# Patient Record
Sex: Female | Born: 1935 | Race: White | Hispanic: No | Smoking: Former smoker
Health system: Southern US, Community
[De-identification: ages and names within clinical notes are randomized; demographics above are authoritative.]

## PROBLEM LIST (undated history)

## (undated) DIAGNOSIS — I2699 Other pulmonary embolism without acute cor pulmonale: Secondary | ICD-10-CM

## (undated) DIAGNOSIS — I499 Cardiac arrhythmia, unspecified: Secondary | ICD-10-CM

## (undated) DIAGNOSIS — I714 Abdominal aortic aneurysm, without rupture, unspecified: Secondary | ICD-10-CM

## (undated) DIAGNOSIS — I509 Heart failure, unspecified: Secondary | ICD-10-CM

## (undated) DIAGNOSIS — N289 Disorder of kidney and ureter, unspecified: Secondary | ICD-10-CM

## (undated) DIAGNOSIS — M810 Age-related osteoporosis without current pathological fracture: Secondary | ICD-10-CM

## (undated) DIAGNOSIS — E7800 Pure hypercholesterolemia, unspecified: Secondary | ICD-10-CM

## (undated) DIAGNOSIS — I7090 Unspecified atherosclerosis: Secondary | ICD-10-CM

## (undated) DIAGNOSIS — J449 Chronic obstructive pulmonary disease, unspecified: Secondary | ICD-10-CM

## (undated) DIAGNOSIS — I10 Essential (primary) hypertension: Secondary | ICD-10-CM

## (undated) DIAGNOSIS — M949 Disorder of cartilage, unspecified: Secondary | ICD-10-CM

## (undated) DIAGNOSIS — I2510 Atherosclerotic heart disease of native coronary artery without angina pectoris: Secondary | ICD-10-CM

## (undated) DIAGNOSIS — R0602 Shortness of breath: Secondary | ICD-10-CM

## (undated) DIAGNOSIS — D649 Anemia, unspecified: Secondary | ICD-10-CM

## (undated) DIAGNOSIS — M899 Disorder of bone, unspecified: Secondary | ICD-10-CM

## (undated) DIAGNOSIS — I4891 Unspecified atrial fibrillation: Secondary | ICD-10-CM

## (undated) DIAGNOSIS — M009 Pyogenic arthritis, unspecified: Secondary | ICD-10-CM

## (undated) HISTORY — DX: Abdominal aortic aneurysm, without rupture: I71.4

## (undated) HISTORY — DX: Chronic obstructive pulmonary disease, unspecified: J44.9

## (undated) HISTORY — DX: Unspecified atrial fibrillation: I48.91

## (undated) HISTORY — DX: Essential (primary) hypertension: I10

## (undated) HISTORY — PX: CHOLECYSTECTOMY: SHX55

## (undated) HISTORY — PX: CATARACT EXTRACTION: SUR2

## (undated) HISTORY — DX: Disorder of bone, unspecified: M89.9

## (undated) HISTORY — PX: PARTIAL HYSTERECTOMY: SHX80

## (undated) HISTORY — DX: Unspecified atherosclerosis: I70.90

## (undated) HISTORY — DX: Disorder of kidney and ureter, unspecified: N28.9

## (undated) HISTORY — DX: Age-related osteoporosis without current pathological fracture: M81.0

## (undated) HISTORY — PX: HIP FRACTURE SURGERY: SHX118

## (undated) HISTORY — PX: TONSILLECTOMY: SUR1361

## (undated) HISTORY — DX: Anemia, unspecified: D64.9

## (undated) HISTORY — PX: OTHER SURGICAL HISTORY: SHX169

## (undated) HISTORY — DX: Pure hypercholesterolemia, unspecified: E78.00

## (undated) HISTORY — PX: CARDIAC CATHETERIZATION: SHX172

## (undated) HISTORY — PX: COLONOSCOPY: SHX174

## (undated) HISTORY — DX: Abdominal aortic aneurysm, without rupture, unspecified: I71.40

## (undated) HISTORY — DX: Other pulmonary embolism without acute cor pulmonale: I26.99

## (undated) HISTORY — DX: Cardiac arrhythmia, unspecified: I49.9

## (undated) HISTORY — DX: Pyogenic arthritis, unspecified: M00.9

## (undated) HISTORY — PX: GANGLION CYST EXCISION: SHX1691

## (undated) HISTORY — DX: Disorder of cartilage, unspecified: M94.9

---

## 2012-05-02 ENCOUNTER — Encounter: Payer: Self-pay | Admitting: Internal Medicine

## 2012-05-03 ENCOUNTER — Ambulatory Visit (INDEPENDENT_AMBULATORY_CARE_PROVIDER_SITE_OTHER)
Admission: RE | Admit: 2012-05-03 | Discharge: 2012-05-03 | Disposition: A | Discharge status: Home / Self Care | Payer: Medicare Other | Source: Ambulatory Visit | Attending: Internal Medicine | Admitting: Internal Medicine

## 2012-05-03 ENCOUNTER — Encounter: Payer: Self-pay | Admitting: Internal Medicine

## 2012-05-03 ENCOUNTER — Ambulatory Visit (INDEPENDENT_AMBULATORY_CARE_PROVIDER_SITE_OTHER): Payer: Medicare Other | Admitting: Internal Medicine

## 2012-05-03 VITALS — BP 140/50 | HR 61 | Temp 98.0°F | Ht 62.0 in | Wt 118.0 lb

## 2012-05-03 DIAGNOSIS — R918 Other nonspecific abnormal finding of lung field: Secondary | ICD-10-CM

## 2012-05-03 DIAGNOSIS — C3410 Malignant neoplasm of upper lobe, unspecified bronchus or lung: Secondary | ICD-10-CM | POA: Insufficient documentation

## 2012-05-03 NOTE — Progress Notes (Signed)
  Subjective:    Patient ID: Kara Holloway, female    DOB: [birth year]  MRN: [id number]  HPI  18 yowf quit smoking 1989 because husband's illness, she was fine then started to need inhalers late 1990's and referred 05/03/2012 to pulmonary clinic by Nedra Hai for eval MPN    05/03/2012 1st pulmonary eval/ cc acute arthritis in spring 2013 dx as RA by Dr Ula Lingo in [facility] with cxr showing nodules > CT Chest worrisome for met ca.  On Prednisone x one month and much better. Breathing is fine = doe x fast walk at a mall, steps stops at stop.   No unusual cough, purulent sputum or sinus/hb symptoms on present rx.   Sleeping ok without nocturnal  or early am exacerbation  of respiratory  c/o's or need for noct saba. Also denies any obvious fluctuation of symptoms with weather or environmental changes or other aggravating or alleviating factors except as outlined above      Review of Systems  Constitutional: Negative for fever, chills and unexpected weight change.  HENT: Negative for ear pain, nosebleeds, congestion, sore throat, rhinorrhea, sneezing, trouble swallowing, dental problem, voice change, postnasal drip and sinus pressure.   Eyes: Negative for visual disturbance.  Respiratory: Positive for shortness of breath. Negative for cough and choking.   Cardiovascular: Negative for chest pain and leg swelling.  Gastrointestinal: Negative for vomiting, abdominal pain and diarrhea.  Genitourinary: Negative for difficulty urinating.  Musculoskeletal: Negative for arthralgias.  Skin: Negative for rash.  Neurological: Negative for tremors, syncope and headaches.  Hematological: Does not bruise/bleed easily.       Objective:   Physical Exam  Wt 118 05/03/2012  HEENT mild turbinate edema.  Oropharynx no thrush or excess pnd or cobblestoning.  No JVD or cervical adenopathy. Mild accessory muscle hypertrophy. Trachea midline, nl thryroid. Chest was hyperinflated by percussion with diminished breath sounds and  moderate increased exp time without wheeze. Hoover sign positive at mid inspiration. Regular rate and rhythm without murmur gallop or rub or increase P2 or edema.  Abd: no hsm, nl excursion. Ext warm without cyanosis or clubbing.  ? RA nodule vs ganglio cyst L index finger, no other nodules  CXR  05/03/2012 : Right upper lobe nodules as previously described.           Assessment & Plan:

## 2012-05-03 NOTE — Patient Instructions (Addendum)
Please remember to go to the x-ray department downstairs for your tests - we will call you with the results when they are available.  Please schedule a follow up office visit in 4-6 weeks, call sooner if needed with PFT's on bring the chest rays from [place] with you

## 2012-05-03 NOTE — Progress Notes (Signed)
Quick Note:  Spoke with pt and notified of results per Dr. Wert. Pt verbalized understanding and denied any questions.  ______ 

## 2012-05-03 NOTE — Assessment & Plan Note (Signed)
-  CT chest 04/05/12  Spiculated nodule in the right upper lobe measures 1.6 x 1 cm.  A second nodule in the right upper lobe measures 1.3 x 1.2 cm   Small nodules are noted bilateral lower lobe and right upper lobe anteriorly. Metastatic disease cannot be excluded.  -CXR 05/03/2012 13 mm spiculated lesion in the right upper lobe. 15 mm  more lobular nodule superior lateral to the right hilum t  lung clear.    These lesions not viz on plain cxr 05/04/11 but this was before the acute onset of RA symptoms so it's possible these are RA nodules but if so should regress as RA is treated.  Unfortunately may have Stage IV lung ca also in ddx and will need a tissue dx, probably requiring ebus,  For definitive dx.  Since "early" dx of Stage IV not really likely to be helpful here at age 78, gave pt option of conservative f/u and she agreed to return in 6 weeks with old xrays and if we can docoment definite progression of nodules while ra under better control then consider bx then.

## 2012-06-19 ENCOUNTER — Ambulatory Visit: Payer: Medicare Other | Admitting: Internal Medicine

## 2012-07-11 ENCOUNTER — Ambulatory Visit (INDEPENDENT_AMBULATORY_CARE_PROVIDER_SITE_OTHER)
Admission: RE | Admit: 2012-07-11 | Discharge: 2012-07-11 | Disposition: A | Discharge status: Home / Self Care | Payer: Medicare Other | Source: Ambulatory Visit | Attending: Internal Medicine | Admitting: Internal Medicine

## 2012-07-11 ENCOUNTER — Ambulatory Visit (INDEPENDENT_AMBULATORY_CARE_PROVIDER_SITE_OTHER): Payer: Medicare Other | Admitting: Internal Medicine

## 2012-07-11 ENCOUNTER — Encounter: Payer: Self-pay | Admitting: Internal Medicine

## 2012-07-11 VITALS — BP 138/62 | HR 70 | Temp 97.0°F | Ht 63.0 in | Wt 122.0 lb

## 2012-07-11 DIAGNOSIS — J449 Chronic obstructive pulmonary disease, unspecified: Secondary | ICD-10-CM

## 2012-07-11 DIAGNOSIS — R918 Other nonspecific abnormal finding of lung field: Secondary | ICD-10-CM

## 2012-07-11 DIAGNOSIS — Z23 Encounter for immunization: Secondary | ICD-10-CM

## 2012-07-11 LAB — PULMONARY FUNCTION TEST

## 2012-07-11 NOTE — Progress Notes (Signed)
  Subjective:    Patient ID: Kara Holloway, female    DOB: 1926-05-31  MRN: [id number]  HPI  3 yowf quit smoking 1989 doe at that time did require "some inhalers" better once spiriva started referred 05/03/2012 to pulmonary clinic by Nedra Hai for eval MPN proved to have GOLD III 07/11/2012    05/03/2012 1st pulmonary eval/ cc acute arthritis in spring 2013 dx as RA by Dr Ula Lingo in [facility] with cxr showing nodules > CT Chest worrisome for met ca.  On Prednisone x one month and much better. Breathing is fine = doe x fast walk at a mall, steps ok but stops at stop rec Please schedule a follow up office visit in 4-6 weeks, call sooner if needed with PFT's on bring the chest rays [place] with you > "I had them sent"  07/11/2012 f/u ov/Wert cc doe no change = x walking to friend's 3/4 block flat not as good with hills. No obvious daytime variabilty or assoc chronic cough or cp or chest tightness, subjective wheeze overt sinus or hb symptoms. No unusual exp hx    Was on just spiriva and ok then added advair 250 but just once a day and no better    Sleeping ok without nocturnal  or early am exacerbation  of respiratory  c/o's or need for noct saba. Also denies any obvious fluctuation of symptoms with weather or environmental changes or other aggravating or alleviating factors except as outlined above.    ROS  The following are not active complaints unless bolded sore throat, dysphagia, dental problems, itching, sneezing,  nasal congestion or excess/ purulent secretions, ear ache,   fever, chills, sweats, unintended wt loss, pleuritic or exertional cp, hemoptysis,  orthopnea pnd or leg swelling, presyncope, palpitations, heartburn, abdominal pain, anorexia, nausea, vomiting, diarrhea  or change in bowel or urinary habits, change in stools or urine, dysuria,hematuria,  rash, arthralgias, visual complaints, headache, numbness weakness or ataxia or problems with walking or coordination,  change in mood/affect  or memory.              Objective:   Physical Exam  Wt 118 05/03/2012 > 122 07/11/2012  HEENT mild turbinate edema.  Oropharynx no thrush or excess pnd or cobblestoning.  No JVD or cervical adenopathy. Mild accessory muscle hypertrophy. Trachea midline, nl thryroid. Chest was hyperinflated by percussion with diminished breath sounds and moderate increased exp time without wheeze. Hoover sign positive at mid inspiration. Regular rate and rhythm without murmur gallop or rub or increase P2 or edema.  Abd: no hsm, nl excursion. Ext warm without cyanosis or clubbing.  ? RA nodule vs ganglio cyst L index finger, no other nodules    CXR  07/11/2012 :  1. Nodular densities in the right upper lobe persist > no change  Vs previous studies           Assessment & Plan:

## 2012-07-11 NOTE — Assessment & Plan Note (Addendum)
-  CT chest 04/05/12  Spiculated nodule in the right upper lobe measures 1.6 x 1 cm.  A second nodule in the right upper lobe measures 1.3 x 1.2 cm   Small nodules are noted bilateral lower lobe and right upper lobe anteriorly. Metastatic disease cannot be excluded.  -CXR 05/03/2012 13 mm spiculated lesion in the right upper lobe. 15 mm  more lobular nodule superior lateral to the right hilum t  lung clear.   - Cxr 07/11/2012 no change multiple rul nodules  Given severity of copd and multple locations of nodules in pt with presumed RA these are likely benign with no significant short term growth but the only way to be sure they're all benign is either remove all the big ones (which is not practical esp given GOLD III copd or follow with plain cxr and consider bx of any that show def "macroscopic" growth  Discussed in detail all the  indications, usual  risks and alternatives  relative to the benefits with patient who agrees to proceed with conservative f/u at 6 months

## 2012-07-11 NOTE — Patient Instructions (Addendum)
Please remember to go to the x-ray department downstairs for your tests - we will call you with the results when they are available.  If your breathing is doing the same after you stop prednisone for a few weeks ok to try leaving off the advair and see if you note any change in your activity tolerance and if not just leave it off  Please schedule a follow up visit in 6 months but call sooner if needed

## 2012-07-11 NOTE — Assessment & Plan Note (Signed)
-   PFT's 07/11/2012  FEV1 0.69 (43%) ratio 36 and no better with B2 and DLCO 44 corrects to 69  GOLD III but relatively well compensated on spiriva and minimal advair so probably ok to try off advair

## 2012-07-11 NOTE — Progress Notes (Signed)
PFT done today. 

## 2012-07-12 NOTE — Progress Notes (Signed)
Quick Note:  Spoke with pt and notified of results per Dr. Wert. Pt verbalized understanding and denied any questions.  ______ 

## 2012-07-31 ENCOUNTER — Encounter: Payer: Self-pay | Admitting: Internal Medicine

## 2012-12-05 ENCOUNTER — Telehealth: Payer: Self-pay | Admitting: *Deleted

## 2012-12-05 NOTE — Telephone Encounter (Signed)
Spoke with pt and notified due for rov with cxr She verbalized understanding and I have scheduled appt with MW for 12/19/12 at 2:15 pm

## 2012-12-05 NOTE — Telephone Encounter (Signed)
Message copied by Christen Butter on Thu Dec 05, 2012  4:48 PM ------      Message from: Sandrea Hughs B      Created: Thu Jul 11, 2012  1:37 PM       Ov with cxr due ------

## 2012-12-19 ENCOUNTER — Encounter: Payer: Self-pay | Admitting: Internal Medicine

## 2012-12-19 ENCOUNTER — Ambulatory Visit (INDEPENDENT_AMBULATORY_CARE_PROVIDER_SITE_OTHER)
Admission: RE | Admit: 2012-12-19 | Discharge: 2012-12-19 | Disposition: A | Discharge status: Home / Self Care | Payer: Medicare Other | Source: Ambulatory Visit | Attending: Internal Medicine | Admitting: Internal Medicine

## 2012-12-19 ENCOUNTER — Ambulatory Visit (INDEPENDENT_AMBULATORY_CARE_PROVIDER_SITE_OTHER): Payer: Medicare Other | Admitting: Internal Medicine

## 2012-12-19 VITALS — BP 128/68 | HR 64 | Temp 98.6°F | Ht 62.0 in | Wt 128.8 lb

## 2012-12-19 DIAGNOSIS — R918 Other nonspecific abnormal finding of lung field: Secondary | ICD-10-CM

## 2012-12-19 DIAGNOSIS — J449 Chronic obstructive pulmonary disease, unspecified: Secondary | ICD-10-CM

## 2012-12-19 MED ORDER — ALBUTEROL SULFATE HFA 108 (90 BASE) MCG/ACT IN AERS: 2.0000 | INHALATION_SPRAY | Freq: Four times a day (QID) | RESPIRATORY_TRACT | Status: DC | PRN

## 2012-12-19 NOTE — Progress Notes (Signed)
  Subjective:    Patient ID: Kara Holloway, female    DOB: July 16, 1926  MRN: [id number]  HPI  17 yowf quit smoking 1989 doe at that time did require "some inhalers" better once spiriva started referred 05/03/2012 to pulmonary clinic by Kara Holloway for eval MPN proved to have GOLD III 07/11/2012    05/03/2012 1st pulmonary eval/ cc acute arthritis in spring 2013 dx as RA by Dr Kara Holloway in [facility] with cxr showing nodules > CT Chest worrisome for met ca.  On Prednisone x one month and much better. Breathing is fine = doe x fast walk at a mall, steps ok but stops at stop rec Please schedule a follow up office visit in 4-6 weeks, call sooner if needed with PFT's and  bring [place] chest rays [place] you > "I had them sent"  07/11/2012 f/u ov/Kara Holloway cc doe no change = x walking to friend's 3/4 block flat not as good with hills.   Was on just spiriva and ok then added advair 250 but just once a day and no better rec Ok to try wean advair back to off if not helping   12/19/2012 f/u ov/Kara Holloway f/u copd/ mpn Chief Complaint  Patient presents with  . Follow-up    Breathing about the same since the last visit.    No obvious daytime variabilty or assoc chronic cough or cp or chest tightness, subjective wheeze overt sinus or hb symptoms. No unusual exp hx or h/o childhood pna/ asthma or premature birth to her knowledge.  Prednisone is at 5 mg per day and RA symptoms tolerable at present with only a few flares x 6 months      Sleeping ok without nocturnal  or early am exacerbation  of respiratory  c/o's or need for noct saba. Also denies any obvious fluctuation of symptoms with weather or environmental changes or other aggravating or alleviating factors except as outlined above.    ROS  The following are not active complaints unless bolded sore throat, dysphagia, dental problems, itching, sneezing,  nasal congestion or excess/ purulent secretions, ear ache,   fever, chills, sweats, unintended wt loss, pleuritic  or exertional cp, hemoptysis,  orthopnea pnd or leg swelling, presyncope, palpitations, heartburn, abdominal pain, anorexia, nausea, vomiting, diarrhea  or change in bowel or urinary habits, change in stools or urine, dysuria,hematuria,  rash, arthralgias, visual complaints, headache, numbness weakness or ataxia or problems with walking or coordination,  change in mood/affect or memory.              Objective:   Physical Exam  Wt 118 05/03/2012 > 122 07/11/2012 >  128 12/19/2012  HEENT mild turbinate edema.  Oropharynx no thrush or excess pnd or cobblestoning.  No JVD or cervical adenopathy. Mild accessory muscle hypertrophy. Trachea midline, nl thryroid. Chest was hyperinflated by percussion with diminished breath sounds and moderate increased exp time without wheeze. Hoover sign positive at mid inspiration. Regular rate and rhythm without murmur gallop or rub or increase P2 or edema.  Abd: no hsm, nl excursion. Ext warm without cyanosis or clubbing.  ? RA nodule vs ganglio cyst L index finger, no other nodules    CXR  12/19/2012 :  Progression in size of nodule in the right upper lobe            Assessment & Plan:

## 2012-12-19 NOTE — Assessment & Plan Note (Signed)
-  CT chest 04/05/12  Spiculated nodule in the right upper lobe measures 1.6 x 1 cm.  A second nodule in the right upper lobe measures 1.3 x 1.2 cm   Small nodules are noted bilateral lower lobe and right upper lobe anteriorly. Metastatic disease cannot be excluded.  -CXR 05/03/2012 13 mm spiculated lesion in the right upper lobe. 15 mm  more lobular nodule superior lateral to the right hilum t  lung clear.   - Cxr 07/11/2012 no change multiple rul nodules - CXR  12/19/2012 :  Progression in size of nodule in the right upper lobe (referring to the most inferior of the nodules)   In a pt with RA and multiple nodules, the one that seems to be growing relative to the others may be lung ca and unrelated to the other nodules but the pt ifs extremely reluctant to undergo any diagnostic studies at this time.  Discussed in detail all the  indications, usual  risks and alternatives  relative to the benefits with patient who agrees to proceed with conservative f/u.

## 2012-12-19 NOTE — Assessment & Plan Note (Addendum)
PFT's 07/11/2012  FEV1 0.69 (43%) ratio 36 and no better with B2 and DLCO 44 corrects to 69  Relatively well compensated on spiriva and daily prednisone so probably doesn't need advair but should sotop combent at this point and just use prn albuterol but monitor whether she needs more once she stops advair.

## 2012-12-19 NOTE — Patient Instructions (Addendum)
Don't stop spiriva but since you're on prednisone daily you may not see much benefit from advair (purple disc) and ok to wean off if makes no difference in how you feel or how  Much you need combivent (or proaire, the preferred rescue inhaler)  Please schedule a follow up visit in 6  months but call sooner if needed

## 2013-06-06 ENCOUNTER — Encounter: Payer: Self-pay | Admitting: Internal Medicine

## 2013-06-06 ENCOUNTER — Telehealth: Payer: Self-pay | Admitting: *Deleted

## 2013-06-06 NOTE — Telephone Encounter (Signed)
Spoke with the pt and scheduled appt for 06/19/13 at 1:30

## 2013-06-06 NOTE — Telephone Encounter (Signed)
Message copied by Christen Butter on Fri Jun 06, 2013  2:51 PM ------      Message from: Sandrea Hughs B      Created: Fri Jun 06, 2013  9:00 AM       Needs ov re lung nodules this month ------

## 2013-06-19 ENCOUNTER — Encounter: Payer: Self-pay | Admitting: Internal Medicine

## 2013-06-19 ENCOUNTER — Ambulatory Visit (INDEPENDENT_AMBULATORY_CARE_PROVIDER_SITE_OTHER): Payer: Medicare Other | Admitting: Internal Medicine

## 2013-06-19 VITALS — BP 132/62 | HR 68 | Temp 97.6°F | Ht 62.0 in | Wt 129.0 lb

## 2013-06-19 DIAGNOSIS — J449 Chronic obstructive pulmonary disease, unspecified: Secondary | ICD-10-CM

## 2013-06-19 DIAGNOSIS — R918 Other nonspecific abnormal finding of lung field: Secondary | ICD-10-CM

## 2013-06-19 NOTE — Patient Instructions (Signed)
Please schedule a follow up visit in 3 months but call sooner if needed with CXR on return

## 2013-06-19 NOTE — Assessment & Plan Note (Signed)
-   PFT's 07/11/2012  FEV1 0.69 (43%) ratio 36 and no better with B2 and DLCO 44 corrects to 69   Adequate control on present rx, reviewed > no change in rx needed

## 2013-06-19 NOTE — Progress Notes (Signed)
Subjective:    Patient ID: Kara Holloway, female    DOB: 02-Dec-1925  MRN: [id number]    Brief patient profile:   37 yowf quit smoking 1989 doe at that time did require "some inhalers" better once spiriva started referred 05/03/2012 to pulmonary clinic by Nedra Hai for eval MPN proved to have GOLD III 07/11/2012   History of Present Illness  05/03/2012 1st pulmonary eval/ cc acute arthritis in spring 2013 dx as RA by Dr Ula Lingo in [facility] with cxr showing nodules > CT Chest worrisome for met ca.  On Prednisone x one month and much better. Breathing is fine = doe x fast walk at a mall, steps ok but stops at top on one flight rec Please schedule a follow up office visit in 4-6 weeks, call sooner if needed with PFT's and  bring [place] chest rays [place] you > "I had them sent"  07/11/2012 f/u ov/Kara Holloway cc doe no change = x walking to friend's 3/4 block flat not as good with hills.   Was on just spiriva and ok then added advair 250 but just once a day and no better rec Ok to try wean advair back to off if not helping      06/19/2013 f/u ov/Kara Holloway re: mpns/ RA/ Chief Complaint  Patient presents with  . Follow-up    Pt states that her breathing is doing well. She states that last time she needed combivent for rescue was approx 3 wks ago.   Arthritis is slt worse no change pred @ 5 mg daily  No skin nodules Sob one side of house to other on advair/ spiriva and prn combivent.  No hemoptysis    No obvious day to day or daytime variabilty or assoc chronic cough or cp or chest tightness, subjective wheeze overt sinus or hb symptoms. No unusual exp hx or h/o childhood pna/ asthma or knowledge of premature birth.  Sleeping ok without nocturnal  or early am exacerbation  of respiratory  c/o's or need for noct saba. Also denies any obvious fluctuation of symptoms with weather or environmental changes or other aggravating or alleviating factors except as outlined above   Current Medications, Allergies,  Complete Past Medical History, Past Surgical History, Family History, and Social History were reviewed in [employer] record.  ROS  The following are not active complaints unless bolded sore throat, dysphagia, dental problems, itching, sneezing,  nasal congestion or excess/ purulent secretions, ear ache,   fever, chills, sweats, unintended wt loss, pleuritic or exertional cp, hemoptysis,  orthopnea pnd or leg swelling, presyncope, palpitations, heartburn, abdominal pain, anorexia, nausea, vomiting, diarrhea  or change in bowel or urinary habits, change in stools or urine, dysuria,hematuria,  rash, arthralgias, visual complaints, headache, numbness weakness or ataxia or problems with walking or coordination,  change in mood/affect or memory.           Objective:   Physical Exam  Wt 118 05/03/2012 > 122 07/11/2012 >  128 12/19/2012 > 129 06/19/2013  HEENT mild turbinate edema.  Oropharynx no thrush or excess pnd or cobblestoning.  No JVD or cervical adenopathy. Mild accessory muscle hypertrophy. Trachea midline, nl thryroid. Chest was hyperinflated by percussion with diminished breath sounds and moderate increased exp time without wheeze. Hoover sign positive at mid inspiration. Regular rate and rhythm without murmur gallop or rub or increase P2 or edema.  Abd: no hsm, nl excursion. Ext warm without cyanosis or clubbing.  ? RA nodule vs ganglio cyst  L index finger, no other nodules     CXR  06/04/13  RUL 2.5 cm  06/04/13  (larger)            Assessment & Plan:

## 2013-06-19 NOTE — Assessment & Plan Note (Signed)
-  CT chest 04/05/12  Spiculated nodule in the right upper lobe measures 1.6 x 1 cm.  A second nodule in the right upper lobe measures 1.3 x 1.2 cm   Small nodules are noted bilateral lower lobe and right upper lobe anteriorly. Metastatic disease cannot be excluded.  -CXR 05/03/2012 13 mm spiculated lesion in the right upper lobe. 15 mm  more lobular nodule superior lateral to the right hilum t  lung clear.   - Cxr 07/11/2012 no change multiple rul nodules - CXR  12/19/2012 :  Progression in size of nodule in the right upper lobe (referring to the most inferior of the nodules) > RUL 2.5 cm  06/04/13     I had an extended discussion with the patient and daughter today lasting 15 to 20 minutes of a 25 minute visit on the following issues:  1) favor met lung ca over RA nodules, none easy to bx 2) she is very reluctant to consider bx, much less take rx at age 79, daughter appears to support her decision  F/u in 3 m with cxr

## 2013-09-18 ENCOUNTER — Ambulatory Visit (INDEPENDENT_AMBULATORY_CARE_PROVIDER_SITE_OTHER)
Admission: RE | Admit: 2013-09-18 | Discharge: 2013-09-18 | Disposition: A | Discharge status: Home / Self Care | Payer: Medicare Other | Source: Ambulatory Visit | Attending: Internal Medicine | Admitting: Internal Medicine

## 2013-09-18 ENCOUNTER — Encounter: Payer: Self-pay | Admitting: Internal Medicine

## 2013-09-18 ENCOUNTER — Ambulatory Visit (INDEPENDENT_AMBULATORY_CARE_PROVIDER_SITE_OTHER): Payer: Medicare Other | Admitting: Internal Medicine

## 2013-09-18 VITALS — BP 108/60 | HR 84 | Temp 98.0°F | Ht 62.0 in | Wt 131.8 lb

## 2013-09-18 DIAGNOSIS — R918 Other nonspecific abnormal finding of lung field: Secondary | ICD-10-CM

## 2013-09-18 DIAGNOSIS — J449 Chronic obstructive pulmonary disease, unspecified: Secondary | ICD-10-CM

## 2013-09-18 MED ORDER — PREDNISONE 5 MG PO TABS: ORAL_TABLET | ORAL | Status: DC

## 2013-09-18 NOTE — Assessment & Plan Note (Signed)
-  CT chest 04/05/12  Spiculated nodule in the right upper lobe measures 1.6 x 1 cm.  A second nodule in the right upper lobe measures 1.3 x 1.2 cm   Small nodules are noted bilateral lower lobe and right upper lobe anteriorly. Metastatic disease cannot be excluded.  -CXR 05/03/2012 13 mm spiculated lesion in the right upper lobe. 15 mm  more lobular nodule superior lateral to the right hilum t  lung clear.   - Cxr 07/11/2012 no change multiple rul nodules - CXR  12/19/2012 :  Progression in size of nodule in the right upper lobe (referring to the most inferior of the nodules) > RUL 2.5 cm  06/04/13   - see ov 06/19/2013 > declines PET/bx until p New Year's  Discussed in detail all the  indications, usual  risks and alternatives  relative to the benefits with patient who agrees to proceed with bronchoscopy with biopsy >>  PET ordered

## 2013-09-18 NOTE — Patient Instructions (Signed)
Prednisone 20 mg per day until arthritis and breathing better, then 10 mg per day for a week, then try 10 alternating with 5 for a week, then 5 mg daily  Please see patient coordinator before you leave today  to schedule PET scan and I will call with results when available with recommendations for next step

## 2013-09-18 NOTE — Progress Notes (Signed)
Subjective:    Patient ID: Kara Holloway, female    DOB: 1926/07/21  MRN: [id number]    Brief patient profile:   81 yowf quit smoking 1989 doe at that time did require "some inhalers" better once spiriva started referred 05/03/2012 to pulmonary clinic by Truman Hayward for eval MPN proved to have GOLD III 07/11/2012   History of Present Illness  05/03/2012 1st pulmonary eval/ cc acute arthritis in spring 2013 dx as RA by Dr Gennaro Africa in [facility] with cxr showing nodules > CT Chest worrisome for met ca.  On Prednisone x one month and much better. Breathing is fine = doe x fast walk at a mall, steps ok but stops at top on one flight rec Please schedule a follow up office visit in 4-6 weeks, call sooner if needed with PFT's and  [place] > "I had them sent"  07/11/2012 f/u ov/Daylani Deblois cc doe no change = x walking to friend's 3/4 block flat not as good with hills.   Was on just spiriva and ok then added advair 250 but just once a day and no better rec Ok to try wean advair back to off if not helping      06/19/2013 f/u ov/Annika Selke re: mpns/ RA/ Chief Complaint  Patient presents with  . Follow-up    Pt states that her breathing is doing well. She states that last time she needed combivent for rescue was approx 3 wks ago.   Arthritis is slt worse no change pred @ 5 mg daily  No skin nodules Sob one side of house to other on advair/ spiriva and prn combivent.  No hemoptysis rec No change rx  09/18/2013 f/u ov/Airrion Otting re: MPN/ RA Chief Complaint  Patient presents with  . Follow-up    Pt states breathing is progressivley worse since her last visit. She uses combivent once every 2-3 wks.      Arthritis is worse on 5 mg per day maint and wants to know why she can't take more. Doe x more than slow adls on advair now bid (helps a little in the am to take the pm dose) and spiriva qam  No obvious day to day or daytime variabilty or assoc chronic cough or cp or chest tightness,  subjective wheeze overt sinus or hb symptoms. No unusual exp hx or h/o childhood pna/ asthma or knowledge of premature birth.  Sleeping ok without nocturnal  or early am exacerbation  of respiratory  c/o's or need for noct saba. Also denies any obvious fluctuation of symptoms with weather or environmental changes or other aggravating or alleviating factors except as outlined above   Current Medications, Allergies, Complete Past Medical History, Past Surgical History, Family History, and Social History were reviewed in [employer] record.  ROS  The following are not active complaints unless bolded sore throat, dysphagia, dental problems, itching, sneezing,  nasal congestion or excess/ purulent secretions, ear ache,   fever, chills, sweats, unintended wt loss, pleuritic or exertional cp, hemoptysis,  orthopnea pnd or leg swelling, presyncope, palpitations, heartburn, abdominal pain, anorexia, nausea, vomiting, diarrhea  or change in bowel or urinary habits, change in stools or urine, dysuria,hematuria,  rash, arthralgias, visual complaints, headache, numbness weakness or ataxia or problems with walking or coordination,  change in mood/affect or memory.           Objective:   Physical Exam  Wt 118 05/03/2012 > 122 07/11/2012 >  128 12/19/2012 >  129 06/19/2013 > 09/18/2013 132  HEENT mild turbinate edema.  Oropharynx no thrush or excess pnd or cobblestoning.  No JVD or cervical adenopathy. Mild accessory muscle hypertrophy. Trachea midline, nl thryroid. Chest was hyperinflated by percussion with diminished breath sounds and moderate increased exp time without wheeze. Hoover sign positive at mid inspiration. Regular rate and rhythm without murmur gallop or rub or increase P2 or edema.  Abd: no hsm, nl excursion. Ext warm without cyanosis or clubbing.  ? RA nodule vs ganglio cyst L index finger, no other nodules     CXR  06/04/13  RUL 2.5 cm  06/04/13  (larger)  CXR  09/18/2013 :   There are 2 spiculated right upper lobe nodules measuring 2.7 and 2.9 cm respectively most concerning for malignancy. There are no other pulmonary masses. There is no pleural effusion or pneumothorax. The lungs are hyperinflated likely secondary to COPD. Stable cardiomediastinal silhouette. Degenerative disc disease throughout the thoracic spine.             Assessment & Plan:

## 2013-09-18 NOTE — Assessment & Plan Note (Signed)
-   PFT's 07/11/2012  FEV1 0.69 (43%) ratio 36 and no better with B2 and DLCO 44 corrects to 69  Severe but slt better on bid advair and has noted better breathing when on pred in past so could have a component of RA bronchiolitis/ ild (though the nodules are much more likely CA  rec The goal with a chronic steroid dependent illness is always arriving at the lowest effective dose that controls the disease/symptoms and not accepting a set "formula" which is based on statistics or guidelines that don't always take into account patient  variability or the natural hx of the dz in every individual patient, which may well vary over time.  For now therefore I recommend the patient maintain  A ceiling of 20 mg and a floor of 5 mg daily

## 2013-10-09 ENCOUNTER — Encounter (HOSPITAL_COMMUNITY)
Admission: RE | Admit: 2013-10-09 | Discharge: 2013-10-09 | Disposition: A | Discharge status: Home / Self Care | Payer: Medicare Other | Source: Ambulatory Visit | Attending: Internal Medicine | Admitting: Internal Medicine

## 2013-10-09 ENCOUNTER — Encounter: Payer: Self-pay | Admitting: Internal Medicine

## 2013-10-09 DIAGNOSIS — I708 Atherosclerosis of other arteries: Secondary | ICD-10-CM | POA: Insufficient documentation

## 2013-10-09 DIAGNOSIS — C3490 Malignant neoplasm of unspecified part of unspecified bronchus or lung: Secondary | ICD-10-CM | POA: Insufficient documentation

## 2013-10-09 DIAGNOSIS — I700 Atherosclerosis of aorta: Secondary | ICD-10-CM | POA: Insufficient documentation

## 2013-10-09 DIAGNOSIS — I714 Abdominal aortic aneurysm, without rupture, unspecified: Secondary | ICD-10-CM | POA: Insufficient documentation

## 2013-10-09 DIAGNOSIS — N83209 Unspecified ovarian cyst, unspecified side: Secondary | ICD-10-CM | POA: Insufficient documentation

## 2013-10-09 DIAGNOSIS — E041 Nontoxic single thyroid nodule: Secondary | ICD-10-CM | POA: Insufficient documentation

## 2013-10-09 DIAGNOSIS — R918 Other nonspecific abnormal finding of lung field: Secondary | ICD-10-CM | POA: Insufficient documentation

## 2013-10-09 LAB — GLUCOSE, CAPILLARY: GLUCOSE-CAPILLARY: 75 mg/dL (ref 70–99)

## 2013-10-09 MED ORDER — FLUDEOXYGLUCOSE F - 18 (FDG) INJECTION
6.8000 | Freq: Once | INTRAVENOUS | Status: AC | PRN
  Administered 2013-10-09: 6.8 via INTRAVENOUS

## 2013-10-10 ENCOUNTER — Encounter: Payer: Self-pay | Admitting: Internal Medicine

## 2013-10-13 ENCOUNTER — Telehealth: Payer: Self-pay | Admitting: Pulmonary Disease

## 2013-10-13 NOTE — Telephone Encounter (Signed)
Pt is pt of MW.  Pt is wanting an up an appt update, please.  Satira Anis

## 2013-10-13 NOTE — Telephone Encounter (Signed)
I spoke with the pt and she states she has thought about it and wants to proceed with the bronch rec by MW. Pt states has to be scheduled on a Thursday. Please advise. Morton Bing, CMA

## 2013-10-14 NOTE — Telephone Encounter (Signed)
Discussed in detail all the  indications, usual  risks and alternatives  relative to the benefits with patient who agrees to proceed with bronchoscopy with biopsy p off xarelto x 3 doses

## 2013-10-16 ENCOUNTER — Encounter (HOSPITAL_COMMUNITY): Payer: Self-pay | Admitting: Respiratory Therapy

## 2013-10-16 ENCOUNTER — Ambulatory Visit (HOSPITAL_COMMUNITY)
Admission: RE | Admit: 2013-10-16 | Discharge: 2013-10-16 | Disposition: A | Discharge status: Home / Self Care | Payer: Medicare Other | Source: Ambulatory Visit | Attending: Internal Medicine | Admitting: Internal Medicine

## 2013-10-16 ENCOUNTER — Ambulatory Visit (HOSPITAL_COMMUNITY): Payer: Medicare Other

## 2013-10-16 ENCOUNTER — Encounter (HOSPITAL_COMMUNITY)
Admission: RE | Disposition: A | Discharge status: Home / Self Care | Payer: Self-pay | Source: Ambulatory Visit | Attending: Internal Medicine

## 2013-10-16 DIAGNOSIS — M129 Arthropathy, unspecified: Secondary | ICD-10-CM | POA: Insufficient documentation

## 2013-10-16 DIAGNOSIS — J984 Other disorders of lung: Secondary | ICD-10-CM | POA: Insufficient documentation

## 2013-10-16 DIAGNOSIS — J8410 Pulmonary fibrosis, unspecified: Secondary | ICD-10-CM | POA: Insufficient documentation

## 2013-10-16 DIAGNOSIS — Z87891 Personal history of nicotine dependence: Secondary | ICD-10-CM | POA: Insufficient documentation

## 2013-10-16 DIAGNOSIS — J449 Chronic obstructive pulmonary disease, unspecified: Secondary | ICD-10-CM | POA: Insufficient documentation

## 2013-10-16 DIAGNOSIS — J4489 Other specified chronic obstructive pulmonary disease: Secondary | ICD-10-CM | POA: Insufficient documentation

## 2013-10-16 DIAGNOSIS — R918 Other nonspecific abnormal finding of lung field: Secondary | ICD-10-CM

## 2013-10-16 HISTORY — PX: VIDEO BRONCHOSCOPY: SHX5072

## 2013-10-16 SURGERY — BRONCHOSCOPY, WITH FLUOROSCOPY
Anesthesia: Moderate Sedation | Laterality: Bilateral

## 2013-10-16 MED ORDER — PHENYLEPHRINE HCL 0.25 % NA SOLN: 1.0000 | Freq: Four times a day (QID) | NASAL | Status: DC | PRN

## 2013-10-16 MED ORDER — MIDAZOLAM HCL 5 MG/ML IJ SOLN: 1.0000 mg | Freq: Once | INTRAMUSCULAR | Status: DC

## 2013-10-16 MED ORDER — PHENYLEPHRINE HCL 0.25 % NA SOLN
NASAL | Status: DC | PRN
  Administered 2013-10-16: 1 via NASAL

## 2013-10-16 MED ORDER — MIDAZOLAM HCL 10 MG/2ML IJ SOLN
INTRAMUSCULAR | Status: AC
  Filled 2013-10-16: qty 4

## 2013-10-16 MED ORDER — LIDOCAINE HCL 1 % IJ SOLN
INTRAMUSCULAR | Status: DC | PRN
  Administered 2013-10-16: 6 mL via RESPIRATORY_TRACT

## 2013-10-16 MED ORDER — MIDAZOLAM HCL 10 MG/2ML IJ SOLN
INTRAMUSCULAR | Status: DC | PRN
  Administered 2013-10-16 (×2): 2.5 mg via INTRAVENOUS

## 2013-10-16 MED ORDER — LIDOCAINE HCL 2 % EX GEL
CUTANEOUS | Status: DC | PRN
  Administered 2013-10-16: 1

## 2013-10-16 MED ORDER — MEPERIDINE HCL 100 MG/ML IJ SOLN
100.0000 mg | Freq: Once | INTRAMUSCULAR | Status: AC
  Administered 2013-10-16: 50 mg via INTRAVENOUS

## 2013-10-16 MED ORDER — MEPERIDINE HCL 100 MG/ML IJ SOLN
INTRAMUSCULAR | Status: AC
  Filled 2013-10-16: qty 2

## 2013-10-16 MED ORDER — LIDOCAINE HCL 2 % EX GEL
Freq: Once | CUTANEOUS | Status: DC
  Filled 2013-10-16: qty 5

## 2013-10-16 MED ORDER — SODIUM CHLORIDE 0.9 % IV SOLN
INTRAVENOUS | Status: DC
  Administered 2013-10-16: 13:00:00 via INTRAVENOUS

## 2013-10-16 NOTE — Op Note (Signed)
Bronchoscopy Procedure Note  Date of Operation: 10/16/2013   Pre-op Diagnosis: lung nodule  Post-op Diagnosis: same  Surgeon: Christinia Gully  Anesthesia: Monitored Local Anesthesia with Sedation  Operation: Video Flexible fiberoptic bronchoscopy, diagnostic   Findings: nl airways  Specimen: BAL/ tbbx x one  Estimated Blood Loss: 25 cc   Complications: bled easily from nose as passed through and on fluoro so limted to one bx  Indications and History: See updated H and P same date. The risks, benefits, complications, treatment options and expected outcomes were discussed with the patient.  The possibilities of reaction to medication, pulmonary aspiration, perforation of a viscus, bleeding, failure to diagnose a condition and creating a complication requiring transfusion or operation were discussed with the patient who freely signed the consent.    Description of Procedure: The patient was re-examined in the bronchoscopy suite and the site of surgery properly noted/marked.  The patient was identified  and the procedure verified as Flexible Fiberoptic Bronchoscopy.  A Time Out was held and the above information confirmed.   After the induction of topical nasopharyngeal anesthesia, the patient was positioned  and the bronchoscope was passed through the R naris. The vocal cords were visualized and  1% buffered lidocaine 5 ml was topically placed onto the cords. The cords were nl. The scope was then passed into the trachea.  1% buffered lidocaine given topically. Airways inspected bilaterally to the subsegmental level with the following findings:  1) all airways nl  Intervention 1) BAL RUL 2) TBBx x one with good tissue and blushing on single bx in area of nodule so procedure stopped     The Patient was taken to the Endoscopy Recovery area in satisfactory condition.  Attestation: I performed the procedure.  Christinia Gully, MD Pulmonary and [place] [phone] After 5:30 PM or weekends, call [phone]

## 2013-10-16 NOTE — Progress Notes (Signed)
Video Bronchoscopy done  Intervention bronchial washing Intervention bronchial biopsy  Procedure tolerated well

## 2013-10-16 NOTE — Discharge Instructions (Signed)
Flexible Bronchoscopy, Care After These instructions give you information on caring for yourself after your procedure. Your doctor may also give you more specific instructions. Call your doctor if you have any problems or questions after your procedure. HOME CARE  Do not eat or drink anything for 2 hours after your procedure. If you try to eat or drink before the medicine wears off, food or drink could go into your lungs. You could also burn yourself.  After 2 hours have passed and when you can cough and gag normally, you may eat soft food and drink liquids slowly.  The day after the test, you may eat your normal diet.  You may do your normal activities.  Keep all doctor visits. GET HELP RIGHT AWAY IF:  You get more and more short of breath.  You get lightheaded.  You feel like you are going to pass out (faint).  You have chest pain.  You have new problems that worry you.  You cough up more than a little blood.  You cough up more blood than before. MAKE SURE YOU:  Understand these instructions.  Will watch your condition.  Will get help right away if you are not doing well or get worse. Document Released: 06/18/2009 Document Revised: 06/11/2013 Document Reviewed: 04/25/2013 [facility] Patient Information 2014 [place].  Nothing to eat or drink until                   Today 10/16/2013   Resume xarelto pm 10/17/12 but hold if any active bleeding noted

## 2013-10-16 NOTE — H&P (Addendum)
Brief patient profile:    87 yowf quit smoking 1989 doe at that time did require "some inhalers" better once spiriva started referred 05/03/2012 to pulmonary clinic by Truman Hayward for eval MPN proved to have GOLD III 07/11/2012    History of Present Illness   05/03/2012 1st pulmonary eval/ cc acute arthritis in spring 2013 dx as RA by Dr Gennaro Africa in [facility] with cxr showing nodules > CT Chest worrisome for met ca.  On Prednisone x one month and much better. Breathing is fine = doe x fast walk at a mall, steps ok but stops at top on one flight rec Please schedule a follow up office visit in 4-6 weeks, call sooner if needed with PFT's and  [place] you > "I had them sent"   07/11/2012 f/u ov/Braedan Meuth cc doe no change = x walking to friend's 3/4 block flat not as good with hills.    Was on just spiriva and ok then added advair 250 but just once a day and no better rec Ok to try wean advair back to off if not helping   06/19/2013 f/u ov/Mardie Kellen re: mpns/ RA/ Chief Complaint   Patient presents with   .  Follow-up       Pt states that her breathing is doing well. She states that last time she needed combivent for rescue was approx 3 wks ago.     Arthritis is slt worse no change pred @ 5 mg daily   No skin nodules Sob one side of house to other on advair/ spiriva and prn combivent.   No hemoptysis rec No change rx   09/18/2013 f/u ov/Tsion Inghram re: MPN/ RA Chief Complaint   Patient presents with   .  Follow-up       Pt states breathing is progressivley worse since her last visit. She uses combivent once every 2-3 wks.         Arthritis is worse on 5 mg per day maint and wants to know why she can't take more. Doe x more than slow adls on advair now bid (helps a little in the am to take the pm dose) and spiriva qam   No obvious day to day or daytime variabilty or assoc chronic cough or cp or chest tightness, subjective wheeze overt sinus or hb symptoms. No unusual exp hx or h/o  childhood pna/ asthma or knowledge of premature birth.   Sleeping ok without nocturnal  or early am exacerbation  of respiratory  c/o's or need for noct saba. Also denies any obvious fluctuation of symptoms with weather or environmental changes or other aggravating or alleviating factors except as outlined above    Current Medications, Allergies, Complete Past Medical History, Past Surgical History, Family History, and Social History were reviewed in [employer] record.   ROS  The following are not active complaints unless bolded sore throat, dysphagia, dental problems, itching, sneezing,  nasal congestion or excess/ purulent secretions, ear ache,   fever, chills, sweats, unintended wt loss, pleuritic or exertional cp, hemoptysis,  orthopnea pnd or leg swelling, presyncope, palpitations, heartburn, abdominal pain, anorexia, nausea, vomiting, diarrhea  or change in bowel or urinary habits, change in stools or urine, dysuria,hematuria,  rash, arthralgias, visual complaints, headache, numbness weakness or ataxia or problems with walking or coordination,  change in mood/affect or memory.             Objective:     Physical Exam  Wt 118 05/03/2012 > 122 07/11/2012 >  128 12/19/2012 > 129 06/19/2013 > 09/18/2013 132   HEENT mild turbinate edema.  Oropharynx no thrush or excess pnd or cobblestoning.  No JVD or cervical adenopathy. Mild accessory muscle hypertrophy. Trachea midline, nl thryroid. Chest was hyperinflated by percussion with diminished breath sounds and moderate increased exp time without wheeze. Hoover sign positive at mid inspiration. Regular rate and rhythm without murmur gallop or rub or increase P2 or edema.  Abd: no hsm, nl excursion. Ext warm without cyanosis or clubbing.  ? RA nodule vs ganglio cyst L index finger, no other nodules         CXR  06/04/13  RUL 2.5 cm      CXR  09/18/2013 :  There are 2 spiculated right upper lobe nodules measuring 2.7  and 2.9 cm respectively most concerning for malignancy. There are no other pulmonary masses. There is no pleural effusion or pneumothorax. The lungs are hyperinflated likely secondary to COPD. Stable cardiomediastinal silhouette. Degenerative disc disease throughout the thoracic spine.                     Assessment & Plan:               COPD Gold III    - PFT's 07/11/2012  FEV1 0.69 (43%) ratio 36 and no better with B2 and DLCO 44 corrects to 69   Severe but slt better on bid advair and has noted better breathing when on pred in past so could have a component of RA bronchiolitis/ ild (though the nodules are much more likely CA   rec The goal with a chronic steroid dependent illness is always arriving at the lowest effective dose that controls the disease/symptoms and not accepting a set "formula" which is based on statistics or guidelines that don't always take into account patient  variability or the natural hx of the dz in every individual patient, which may well vary over time.  For now therefore I recommend the patient maintain  A ceiling of 20 mg and a floor of 5 mg daily           Multiple pulmonary nodules -     -CT chest 04/05/12  Spiculated nodule in the right upper lobe measures 1.6 x 1 cm.  A second nodule in the right upper lobe measures 1.3 x 1.2 cm    Small nodules are noted bilateral lower lobe and right upper lobe anteriorly. Metastatic disease cannot be excluded.   -CXR 05/03/2012 13 mm spiculated lesion in the right upper lobe. 15 mm   more lobular nodule superior lateral to the right hilum  - Cxr 07/11/2012 no change multiple rul nodules - CXR  12/19/2012 :  Progression in size of nodule in the right upper lobe (referring to the most inferior of the nodules) > RUL 2.5 cm  06/04/13    - see ov 06/19/2013 > declines PET/bx until p New Year's   Discussed in detail all the  indications, usual  risks and alternatives  relative to the benefits with patient who  agrees to proceed with bronchoscopy with biopsy >>  PET ordered > Pos RUL Nodule with ipsilateral met  Discussed in detail all the  indications, usual  risks and alternatives  relative to the benefits with patient who agrees to proceed with bronchoscopy with biopsy for tissue dx if possible but clearly not a surgical candidate on basis of pfts  10/16/2013  FOB day:  held xarelto x 3 doses, no change in hx or exam    Christinia Gully, MD Pulmonary and [place] [phone] After 5:30 PM or weekends, call [phone]

## 2013-10-17 ENCOUNTER — Encounter (HOSPITAL_COMMUNITY): Payer: Self-pay | Admitting: Internal Medicine

## 2013-10-23 ENCOUNTER — Telehealth: Payer: Self-pay | Admitting: *Deleted

## 2013-10-23 NOTE — Telephone Encounter (Signed)
Message copied by Rosana Berger on Thu Oct 23, 2013 12:05 PM ------      Message from: Christinia Gully B      Created: Mon Oct 20, 2013  3:55 PM                   ----- Message -----         From: Tanda Rockers, MD         Sent: 10/20/2013   3:31 PM           To: Tanda Rockers, MD            Needs appt with Dr Lamonte Sakai consult re lung nodule in RA with severe copd ? Ca vs RA nodule ? EBUS candidate?       Ok in next 6 weeks, no rush ------

## 2013-10-23 NOTE — Telephone Encounter (Signed)
Spoke with the pt and scheduled appt with RB for 11/06/13

## 2013-11-06 ENCOUNTER — Ambulatory Visit (INDEPENDENT_AMBULATORY_CARE_PROVIDER_SITE_OTHER): Payer: Medicare Other | Admitting: Emergency Medicine

## 2013-11-06 ENCOUNTER — Encounter: Payer: Self-pay | Admitting: Emergency Medicine

## 2013-11-06 VITALS — BP 140/70 | HR 84 | Ht 62.0 in | Wt 132.6 lb

## 2013-11-06 DIAGNOSIS — R918 Other nonspecific abnormal finding of lung field: Secondary | ICD-10-CM

## 2013-11-06 NOTE — Patient Instructions (Signed)
We will set up electromagnetic navigational bronchoscopy to biopsy your right upper lobe mass You will need to stop your Xarelto 2 days before your procedure.  You will need your CT scan repeated prior to the test

## 2013-11-06 NOTE — Progress Notes (Signed)
Subjective:    Patient ID: Kara Holloway, female    DOB: 28-Apr-1926, 80 y.o.   MRN: [id number]  HPI 80 yo former smoker, 60 pk-yrs, HTN, A Fib, remote PE. She has been dx with RA in the past, has had pulm nodules followed. She has had an increase in size in these and now has PET positive scan in 2/'15. FOB was non-diagnostic. She presents today to discuss another bx attempt using ENB.    Review of Systems  Constitutional: Negative for fever and unexpected weight change.  HENT: Negative for congestion, dental problem, ear pain, nosebleeds, postnasal drip, rhinorrhea, sinus pressure, sneezing, sore throat and trouble swallowing.   Eyes: Negative for redness and itching.  Respiratory: Positive for cough and shortness of breath. Negative for chest tightness and wheezing.   Cardiovascular: Negative for palpitations and leg swelling.  Gastrointestinal: Negative for nausea and vomiting.  Genitourinary: Negative for dysuria.  Musculoskeletal: Negative for joint swelling.  Skin: Negative for rash.  Neurological: Negative for headaches.  Hematological: Bruises/bleeds easily.  Psychiatric/Behavioral: Negative for dysphoric mood. The patient is not nervous/anxious.      Past Medical History  Diagnosis Date  . Osteoporosis   . Kidney disease   . Chronic airway obstruction   . Anemia   . A-fib   . Embolism, pulmonary with infarction   . Hypercholesteremia   . HTN (hypertension)   . Atherosclerosis   . HA (headache)   . Septic arthritis of hip      right hip  . Dysrhythmia   . AAA (abdominal aortic aneurysm)   . Bone/cartilage disorder      Family History  Problem Relation Age of Onset  . Cervical cancer Sister   . Breast cancer Daughter   . Throat cancer Brother   . Coronary artery disease      Uncle  . Heart attack      Uncle  . Stomach cancer Father   . Asthma Son      History   Social History  . Marital Status: Widowed    Spouse Name: N/A    Number of Children: 3  .  Years of Education: N/A   Occupational History  . Retired    Social History Main Topics  . Smoking status: Former Smoker -- 1.50 packs/day for 40 years    Types: Cigarettes    Quit date: 09/05/1987  . Smokeless tobacco: Never Used  . Alcohol Use: Yes     Comment: one glass wine daily  . Drug Use: No  . Sexual Activity: Not on file   Other Topics Concern  . Not on file   Social History Narrative  . No narrative on file     Allergies  Allergen Reactions  . Erythromycin Hives and Swelling  . Shellfish Allergy Hives     Outpatient Prescriptions Prior to Visit  Medication Sig Dispense Refill  . acetaminophen (TYLENOL) 650 MG CR tablet Take 650 mg by mouth every 8 (eight) hours as needed.      Marland Kitchen alendronate (FOSAMAX) 70 MG tablet Take 70 mg by mouth every 7 (seven) days. Take with a full glass of water on an empty stomach.      Marland Kitchen amiodarone (PACERONE) 100 MG tablet Take 50 mg by mouth daily.      Marland Kitchen atorvastatin (LIPITOR) 10 MG tablet Take 10 mg by mouth daily.      . ferrous sulfate 325 (65 FE) MG tablet Take 325 mg by mouth daily with breakfast.      .  Fluticasone-Salmeterol (ADVAIR DISKUS) 250-50 MCG/DOSE AEPB Inhale 1 puff into the lungs daily.      . furosemide (LASIX) 40 MG tablet Take 40 mg by mouth daily.      . Ipratropium-Albuterol (COMBIVENT IN) As directed as needed      . metoprolol tartrate (LOPRESSOR) 25 MG tablet Take 25 mg by mouth daily.      . Multiple Vitamin (MULTIVITAMIN) capsule Take 1 capsule by mouth daily.      . predniSONE (DELTASONE) 5 MG tablet Use as directed  100 tablet  2  . senna (SENOKOT) 8.6 MG tablet Take 1 tablet by mouth daily.      Marland Kitchen tiotropium (SPIRIVA) 18 MCG inhalation capsule Place 18 mcg into inhaler and inhale daily.      . Wheat Dextrin (BENEFIBER DRINK MIX PO) Daily as needed      . vitamin B-12 (CYANOCOBALAMIN) 250 MCG tablet Take 250 mcg by mouth daily.       No facility-administered medications prior to visit.    25/15 --   COMPARISON: DG CHEST 2 VIEW dated 09/18/2013; CT CHEST W/ CM dated  04/05/2012; DG CHEST 2 VIEW dated 05/03/2012  FINDINGS:  NECK  No hypermetabolic lymph nodes in the neck.  Exophytic right posterior thyroid lobe nodule from the left lobe,  2.0 x 2.7 cm, not hypermetabolic, but causing some mass effect on  the adjacent esophagus.  CHEST  Spiculated right upper lobe mass, 3.0 x 2.8 by 3.6 cm, maximum  standard uptake value 22.8. This lesion is bilobed and appears to  represent an amalgamation or confluence of the two prior right upper  lobe nodules which previously measured 1.6 x 1.0 and 1.3 x 1.3 cm.  Right lower of paratracheal lymph node, maximum standard uptake  value 3.7, mildly hypermetabolic.  Atherosclerotic aortic arch and coronary arteries.  Right basilar pulmonary nodules are resolved. Left basilar pulmonary  nodules appear stable from 04/05/2012. Right posterior  hemidiaphragmatic herniation contains adipose tissue.  ABDOMEN/PELVIS  No abnormal hypermetabolic activity within the liver, pancreas,  adrenal glands, or spleen. No hypermetabolic lymph nodes in the  abdomen or pelvis.  4.3 x 4.4 cm infrarenal fusiform abdominal aortic aneurysm. Dense  aortoiliac atherosclerotic calcification. Exophytic cyst from the  left kidney upper pole precontrast hyperdense 0.7 cm left renal  lesion, likely a complex cyst although technically nonspecific.  Exophytic photopenic 2.7 cm cyst from the left kidney lower pole.  Prominent stool throughout the colon favors constipation. Photopenic  1.6 cm left ovarian fluid density lesion.  SKELETON  No focal hypermetabolic activity to suggest skeletal metastasis.  Right hip Knowles type pins. Old left pelvic fractures appear  healed.  IMPRESSION:  1. The two right upper lobe nodules appear to have coalesced into a  3.6 cm right upper lobe mass with maximum standard uptake value  22.8, compatible with malignancy. Mildly hypermetabolic right   paratracheal lymph node is considered positive for ipsilateral  metastatic disease. No additional metastatic disease identified.  2. 4.4 cm infrarenal abdominal aortic aneurysm.  3. Atherosclerosis.  4. Prominent stool throughout the colon favors constipation.  5. Small left ovarian cyst does not require further follow-up.  6. Exophytic left posterior thyroid nodule is not hypermetabolic but  does have extrinsic mass effect on the esophagus.      Objective:   Physical Exam Filed Vitals:   11/06/13 1428  BP: 140/70  Pulse: 84  Height: 5\' 2"  (1.575 m)  Weight: 132 lb 9.6 oz (60.147 kg)  SpO2: 93%   Gen: Pleasant, well-nourished, in no distress,  normal affect  ENT: No lesions,  mouth clear,  oropharynx clear, no postnasal drip  Neck: No JVD, no TMG, no carotid bruits  Lungs: No use of accessory muscles, no dullness to percussion, clear without rales or rhonchi  Cardiovascular: RRR, heart sounds normal, no murmur or gallops, no peripheral edema  Musculoskeletal: No deformities, no cyanosis or clubbing  Neuro: alert, non focal  Skin: Warm, no lesions or rashes     Assessment & Plan:  Multiple pulmonary nodules Agree that this likely represents malignancy. Discussed ENB with her today. She agrees to proceed. Will try to arrange.  - needs CT scan super D - will set up ENB, they would prefer a Thursday - will need to hold xarelto x 2 days -

## 2013-11-06 NOTE — Assessment & Plan Note (Signed)
Agree that this likely represents malignancy. Discussed ENB with her today. She agrees to proceed. Will try to arrange.  - needs CT scan super D - will set up ENB, they would prefer a Thursday - will need to hold xarelto x 2 days -

## 2013-11-07 ENCOUNTER — Telehealth: Payer: Self-pay | Admitting: Emergency Medicine

## 2013-11-07 NOTE — Telephone Encounter (Signed)
Advised pt call was regarding CT scheduled for next week. Pt confirmed appointment and nothing further needed

## 2013-11-10 ENCOUNTER — Encounter (HOSPITAL_COMMUNITY): Payer: Self-pay | Admitting: Pharmacy Technician

## 2013-11-10 LAB — FUNGUS CULTURE W SMEAR
FUNGAL SMEAR: NONE SEEN
Special Requests: NORMAL

## 2013-11-13 ENCOUNTER — Encounter (HOSPITAL_COMMUNITY): Payer: Self-pay

## 2013-11-13 ENCOUNTER — Ambulatory Visit (INDEPENDENT_AMBULATORY_CARE_PROVIDER_SITE_OTHER)
Admission: RE | Admit: 2013-11-13 | Discharge: 2013-11-13 | Disposition: A | Discharge status: Home / Self Care | Payer: Medicare Other | Source: Ambulatory Visit | Attending: Emergency Medicine | Admitting: Emergency Medicine

## 2013-11-13 ENCOUNTER — Encounter (HOSPITAL_COMMUNITY)
Admission: RE | Admit: 2013-11-13 | Discharge: 2013-11-13 | Disposition: A | Discharge status: Home / Self Care | Payer: Medicare Other | Source: Ambulatory Visit | Attending: Emergency Medicine | Admitting: Emergency Medicine

## 2013-11-13 ENCOUNTER — Telehealth: Payer: Self-pay | Admitting: Emergency Medicine

## 2013-11-13 DIAGNOSIS — R918 Other nonspecific abnormal finding of lung field: Secondary | ICD-10-CM

## 2013-11-13 DIAGNOSIS — Z01812 Encounter for preprocedural laboratory examination: Secondary | ICD-10-CM | POA: Insufficient documentation

## 2013-11-13 DIAGNOSIS — Z01810 Encounter for preprocedural cardiovascular examination: Secondary | ICD-10-CM | POA: Insufficient documentation

## 2013-11-13 HISTORY — DX: Atherosclerotic heart disease of native coronary artery without angina pectoris: I25.10

## 2013-11-13 HISTORY — DX: Shortness of breath: R06.02

## 2013-11-13 HISTORY — DX: Heart failure, unspecified: I50.9

## 2013-11-13 LAB — COMPREHENSIVE METABOLIC PANEL
ALT: 16 U/L (ref 0–35)
AST: 30 U/L (ref 0–37)
Albumin: 3.5 g/dL (ref 3.5–5.2)
Alkaline Phosphatase: 82 U/L (ref 39–117)
BILIRUBIN TOTAL: 0.3 mg/dL (ref 0.3–1.2)
BUN: 23 mg/dL (ref 6–23)
CO2: 27 meq/L (ref 19–32)
Calcium: 9.4 mg/dL (ref 8.4–10.5)
Chloride: 96 mEq/L (ref 96–112)
Creatinine, Ser: 1 mg/dL (ref 0.50–1.10)
GFR calc Af Amer: 58 mL/min — ABNORMAL LOW (ref >90)
GFR calc non Af Amer: 50 mL/min — ABNORMAL LOW (ref >90)
Glucose, Bld: 120 mg/dL — ABNORMAL HIGH (ref 70–99)
Potassium: 4.3 mEq/L (ref 3.7–5.3)
SODIUM: 142 meq/L (ref 137–147)
Total Protein: 6.8 g/dL (ref 6.0–8.3)

## 2013-11-13 LAB — CBC
HCT: 40.6 % (ref 36.0–46.0)
HEMOGLOBIN: 13.3 g/dL (ref 12.0–15.0)
MCH: 31.3 pg (ref 26.0–34.0)
MCHC: 32.8 g/dL (ref 30.0–36.0)
MCV: 95.5 fL (ref 78.0–100.0)
Platelets: 343 10*3/uL (ref 150–400)
RBC: 4.25 MIL/uL (ref 3.87–5.11)
RDW: 16.8 % — ABNORMAL HIGH (ref 11.5–15.5)
WBC: 11.4 10*3/uL — ABNORMAL HIGH (ref 4.0–10.5)

## 2013-11-13 LAB — PROTIME-INR
INR: 1.58 — AB (ref 0.00–1.49)
Prothrombin Time: 18.4 seconds — ABNORMAL HIGH (ref 11.6–15.2)

## 2013-11-13 LAB — SURGICAL PCR SCREEN
MRSA, PCR: NEGATIVE
STAPHYLOCOCCUS AUREUS: NEGATIVE

## 2013-11-13 LAB — APTT: aPTT: 37 seconds (ref 24–37)

## 2013-11-13 NOTE — Progress Notes (Signed)
PCP is Dr Truman Hayward  Cardiologist is Dr Bettina Gavia in [place] Pt is unsure of when last cardiac tests were done. She knows she has had a stress test, echo, and card cath, but unsure as to when- states "years ago".  Takes Xarelto 20mg  daily -message left  With Dr Agustina Caroli office to call Ms. Carvey to let her know when to stop taking Xarelto.  request sent to Dr [facility] office for last office visit and if any cardiac tests.

## 2013-11-13 NOTE — Telephone Encounter (Signed)
Per RB to stop Xaralto 2 days prior to Bronch.  Called and left detailed message to stop.  Pt to call back if he has any further questions

## 2013-11-13 NOTE — Pre-Procedure Instructions (Signed)
Kara Holloway  11/13/2013   Your procedure is scheduled on:  March 19 at 0730  Report to [place]  2 * 3 at 0530 AM.  Call this number if you have problems the morning of surgery: [phone]   Remember:   Do not eat food or drink liquids after midnight.   Take these medicines the morning of surgery with A SIP OF WATER: amiodarone (Pacerone), Advair if needed, Combivent if needed, Spiriva,   Lopressor (metoprolol tartrate)  Bring your inhalers with you the day of surgery.  Stop taking aspirin, Aleve, Ibuprofen, Bc's, Goody's, Herbal medications, Fish Oil,   Do not wear jewelry, make-up or nail polish.  Do not wear lotions, powders, or perfumes. You may wear deodorant.  Do not shave 48 hours prior to surgery. Men may shave face and neck.  Do not bring valuables to the hospital.  [facility] is not responsible for any belongings or valuables.               Contacts, dentures or bridgework may not be worn into surgery.  Leave suitcase in the car. After surgery it may be brought to your room.  For patients admitted to the hospital, discharge time is determined by your  treatment team.               Patients discharged the day of surgery will not be allowed to drive  home.    Special Instructions: [facility] - Preparing for Surgery  Before surgery, you can play an important role.  Because skin is not sterile, your skin needs to be as free of germs as possible.  You can reduce the number of germs on you skin by washing with CHG (chlorahexidine gluconate) soap before surgery.  CHG is an antiseptic cleaner which kills germs and bonds with the skin to continue killing germs even after washing.  Please DO NOT use if you have an allergy to CHG or antibacterial soaps.  If your skin becomes reddened/irritated stop using the CHG and inform your nurse when you arrive at Short Stay.  Do not shave (including legs and underarms) for at least 48 hours prior to the first CHG  shower.  You may shave your face.  Please follow these instructions carefully:   1.  Shower with CHG Soap the night before surgery and the                                morning of Surgery.  2.  If you choose to wash your hair, wash your hair first as usual with your       normal shampoo.  3.  After you shampoo, rinse your hair and body thoroughly to remove the                      Shampoo.  4.  Use CHG as you would any other liquid soap.  You can apply chg directly       to the skin and wash gently with scrungie or a clean washcloth.  5.  Apply the CHG Soap to your body ONLY FROM THE NECK DOWN.        Do not use on open wounds or open sores.  Avoid contact with your eyes,       ears, mouth and genitals (private parts).  Wash genitals (private parts)  with your normal soap.  6.  Wash thoroughly, paying special attention to the area where your surgery        will be performed.  7.  Thoroughly rinse your body with warm water from the neck down.  8.  DO NOT shower/wash with your normal soap after using and rinsing off       the CHG Soap.  9.  Pat yourself dry with a clean towel.            10.  Wear clean pajamas.            11.  Place clean sheets on your bed the night of your first shower and do not        sleep with pets.  Day of Surgery  Do not apply any lotions/deoderants the morning of surgery.  Please wear clean clothes to the hospital/surgery center.     Please read over the following fact sheets that you were given: Pain Booklet, Coughing and Deep Breathing and Surgical Site Infection Prevention

## 2013-11-17 ENCOUNTER — Encounter (HOSPITAL_COMMUNITY): Payer: Self-pay

## 2013-11-17 NOTE — Progress Notes (Signed)
Anesthesia Chart Review:  Patient is a 80 year old female scheduled for ENB on 11/20/13 by Dr. Lamonte Sakai for evaluation of a RUL nodule. PET scan showed hypermetabolic activity; however, video flexible bronchoscopy on 10/16/13 under sedation was non-diagnostic.  Other history includes former smoker, CAD s/p Taxus stent to RCA 11/13/03 ([facility]), HTN, afib, remote PE, AAA (measured at 4.3 X 4.4 cm by PET scan 10/09/13), CHF, COPD Gold III, anemia, kidney disease (not specified), RA, osteoporosis, septic arthritis right hip. PCP is Dr. Truman Hayward.  Cardiologist is Dr. Bettina Gavia with [facility]) in [place], last visit 06/04/13 with six month follow-up recommended. Dr. Lamonte Sakai has instructed her to stop Xarelto two days prior to bronchoscopy. Primary pulmonologist is Dr. Melvyn Novas.  EKG on 11/13/13 showed SR with PACs, LAD, anteroseptal infarct (age undetermined).  Overall, I think her EKG is stable when compared to prior tracing on 05/07/13 [facility]).  Echo on 07/01/10 [facility]) showed: Normal LV systolic function, EF 96% (visulaly estimated 60-65%), moderately dilated LA, mildly dilated RA, sclerotic AV with normal valve excursion. A gradient was noted in the LV outflow tract on apical views due to overlap with MR, trivial AR, Mild MR/TR.  Cardiac cath on 11/1103 showed 95% ostial RCA, 20% ostial OM1, 30% ostial left main.  Taxus stent placed in RCA.  Super D chest CT on 11/13/13 showed: 1. Right upper lobe primary bronchogenic carcinoma. A small right paratracheal node corresponds to mild hypermetabolism at PET. This is suspicious but technically indeterminate.  2. Other smaller pulmonary nodules which are similar to the 2013 exam and therefore favored to be benign.  3. Left renal lesion which is likely a minimally complex cyst, similar to 2013.  4. Incompletely imaged infrarenal abdominal aortic aneurysm.  5. Pulmonary artery enlargement suggests pulmonary arterial hypertension.  6. Posterior left-sided thyroid  nodule, minimally enlarged from 2013. This causes mass effect upon the right side of the esophagus.   CXR on 09/18/13 showed: No active cardiopulmonary disease. Two spiculated nodules in the right upper lobe most concerning for malignancy.  Preoperative labs noted.  PT/INR elevated.  PTT 37.  Will repeat PT/INR on arrival.    If follow-up lab results are felt acceptable and there is no acute change in her CV status then it is anticipated that she can proceed as planned. Anesthesiologist Dr. Linna Caprice agrees with this plan.  George Hugh [facility] Short Stay Center/Anesthesiology Phone [phone] 11/17/2013 2:10 PM

## 2013-11-20 ENCOUNTER — Ambulatory Visit (HOSPITAL_COMMUNITY): Payer: Medicare Other

## 2013-11-20 ENCOUNTER — Ambulatory Visit (HOSPITAL_COMMUNITY): Payer: Medicare Other | Admitting: Certified Registered Nurse Anesthetist

## 2013-11-20 ENCOUNTER — Encounter (HOSPITAL_COMMUNITY): Payer: Self-pay | Admitting: Certified Registered Nurse Anesthetist

## 2013-11-20 ENCOUNTER — Encounter (HOSPITAL_COMMUNITY)
Admission: RE | Disposition: A | Discharge status: Home / Self Care | Payer: Self-pay | Source: Ambulatory Visit | Attending: Emergency Medicine

## 2013-11-20 ENCOUNTER — Ambulatory Visit (HOSPITAL_COMMUNITY)
Admission: RE | Admit: 2013-11-20 | Discharge: 2013-11-20 | Disposition: A | Discharge status: Home / Self Care | Payer: Medicare Other | Source: Ambulatory Visit | Attending: Emergency Medicine | Admitting: Emergency Medicine

## 2013-11-20 ENCOUNTER — Encounter (HOSPITAL_COMMUNITY): Payer: Medicare Other | Admitting: Vascular Surgery

## 2013-11-20 DIAGNOSIS — I714 Abdominal aortic aneurysm, without rupture, unspecified: Secondary | ICD-10-CM | POA: Insufficient documentation

## 2013-11-20 DIAGNOSIS — I10 Essential (primary) hypertension: Secondary | ICD-10-CM | POA: Insufficient documentation

## 2013-11-20 DIAGNOSIS — M069 Rheumatoid arthritis, unspecified: Secondary | ICD-10-CM | POA: Insufficient documentation

## 2013-11-20 DIAGNOSIS — C3410 Malignant neoplasm of upper lobe, unspecified bronchus or lung: Secondary | ICD-10-CM | POA: Insufficient documentation

## 2013-11-20 DIAGNOSIS — M810 Age-related osteoporosis without current pathological fracture: Secondary | ICD-10-CM | POA: Insufficient documentation

## 2013-11-20 DIAGNOSIS — J449 Chronic obstructive pulmonary disease, unspecified: Secondary | ICD-10-CM | POA: Insufficient documentation

## 2013-11-20 DIAGNOSIS — J4489 Other specified chronic obstructive pulmonary disease: Secondary | ICD-10-CM | POA: Insufficient documentation

## 2013-11-20 DIAGNOSIS — R222 Localized swelling, mass and lump, trunk: Secondary | ICD-10-CM

## 2013-11-20 DIAGNOSIS — R918 Other nonspecific abnormal finding of lung field: Secondary | ICD-10-CM | POA: Diagnosis present

## 2013-11-20 DIAGNOSIS — Z87891 Personal history of nicotine dependence: Secondary | ICD-10-CM | POA: Insufficient documentation

## 2013-11-20 DIAGNOSIS — E7800 Pure hypercholesterolemia, unspecified: Secondary | ICD-10-CM | POA: Insufficient documentation

## 2013-11-20 DIAGNOSIS — Z86711 Personal history of pulmonary embolism: Secondary | ICD-10-CM | POA: Insufficient documentation

## 2013-11-20 DIAGNOSIS — I7090 Unspecified atherosclerosis: Secondary | ICD-10-CM | POA: Insufficient documentation

## 2013-11-20 DIAGNOSIS — I4891 Unspecified atrial fibrillation: Secondary | ICD-10-CM | POA: Insufficient documentation

## 2013-11-20 HISTORY — PX: VIDEO BRONCHOSCOPY WITH ENDOBRONCHIAL NAVIGATION: SHX6175

## 2013-11-20 LAB — PROTIME-INR
INR: 0.93 (ref 0.00–1.49)
Prothrombin Time: 12.3 seconds (ref 11.6–15.2)

## 2013-11-20 SURGERY — VIDEO BRONCHOSCOPY WITH ENDOBRONCHIAL NAVIGATION
Anesthesia: General

## 2013-11-20 MED ORDER — DEXAMETHASONE SODIUM PHOSPHATE 4 MG/ML IJ SOLN
INTRAMUSCULAR | Status: AC
  Filled 2013-11-20: qty 2

## 2013-11-20 MED ORDER — EPHEDRINE SULFATE 50 MG/ML IJ SOLN
INTRAMUSCULAR | Status: AC
  Filled 2013-11-20: qty 1

## 2013-11-20 MED ORDER — NEOSTIGMINE METHYLSULFATE 1 MG/ML IJ SOLN
INTRAMUSCULAR | Status: AC
  Filled 2013-11-20: qty 10

## 2013-11-20 MED ORDER — LIDOCAINE HCL (CARDIAC) 20 MG/ML IV SOLN
INTRAVENOUS | Status: DC | PRN
  Administered 2013-11-20: 30 mg via INTRAVENOUS
  Administered 2013-11-20: 70 mg via INTRAVENOUS

## 2013-11-20 MED ORDER — PROPOFOL 10 MG/ML IV BOLUS
INTRAVENOUS | Status: AC
  Filled 2013-11-20: qty 20

## 2013-11-20 MED ORDER — 0.9 % SODIUM CHLORIDE (POUR BTL) OPTIME
TOPICAL | Status: DC | PRN
  Administered 2013-11-20: 1000 mL

## 2013-11-20 MED ORDER — LACTATED RINGERS IV SOLN
INTRAVENOUS | Status: DC | PRN
  Administered 2013-11-20: 07:00:00 via INTRAVENOUS

## 2013-11-20 MED ORDER — FENTANYL CITRATE 0.05 MG/ML IJ SOLN
INTRAMUSCULAR | Status: DC | PRN
  Administered 2013-11-20: 75 ug via INTRAVENOUS

## 2013-11-20 MED ORDER — FENTANYL CITRATE 0.05 MG/ML IJ SOLN
INTRAMUSCULAR | Status: AC
  Filled 2013-11-20: qty 5

## 2013-11-20 MED ORDER — EPHEDRINE SULFATE 50 MG/ML IJ SOLN
INTRAMUSCULAR | Status: DC | PRN
  Administered 2013-11-20 (×2): 5 mg via INTRAVENOUS

## 2013-11-20 MED ORDER — PROPOFOL 10 MG/ML IV BOLUS
INTRAVENOUS | Status: DC | PRN
  Administered 2013-11-20: 50 mg via INTRAVENOUS
  Administered 2013-11-20: 30 mg via INTRAVENOUS
  Administered 2013-11-20: 50 mg via INTRAVENOUS

## 2013-11-20 MED ORDER — ROCURONIUM BROMIDE 50 MG/5ML IV SOLN
INTRAVENOUS | Status: AC
  Filled 2013-11-20: qty 1

## 2013-11-20 MED ORDER — PHENYLEPHRINE HCL 10 MG/ML IJ SOLN
10.0000 mg | INTRAMUSCULAR | Status: DC | PRN
  Administered 2013-11-20: 20 ug/min via INTRAVENOUS

## 2013-11-20 MED ORDER — ONDANSETRON HCL 4 MG/2ML IJ SOLN
INTRAMUSCULAR | Status: AC
  Filled 2013-11-20: qty 2

## 2013-11-20 MED ORDER — LIDOCAINE HCL (CARDIAC) 20 MG/ML IV SOLN
INTRAVENOUS | Status: AC
  Filled 2013-11-20: qty 10

## 2013-11-20 MED ORDER — GLYCOPYRROLATE 0.2 MG/ML IJ SOLN
INTRAMUSCULAR | Status: AC
  Filled 2013-11-20: qty 1

## 2013-11-20 MED ORDER — ROCURONIUM BROMIDE 100 MG/10ML IV SOLN
INTRAVENOUS | Status: DC | PRN
  Administered 2013-11-20: 35 mg via INTRAVENOUS

## 2013-11-20 MED ORDER — NEOSTIGMINE METHYLSULFATE 1 MG/ML IJ SOLN
INTRAMUSCULAR | Status: DC | PRN
  Administered 2013-11-20: 3 mg via INTRAVENOUS

## 2013-11-20 MED ORDER — STERILE WATER FOR INJECTION IJ SOLN
INTRAMUSCULAR | Status: AC
  Filled 2013-11-20: qty 10

## 2013-11-20 MED ORDER — GLYCOPYRROLATE 0.2 MG/ML IJ SOLN
INTRAMUSCULAR | Status: DC | PRN
  Administered 2013-11-20: 0.4 mg via INTRAVENOUS
  Administered 2013-11-20: 0.2 mg via INTRAVENOUS

## 2013-11-20 MED ORDER — ONDANSETRON HCL 4 MG/2ML IJ SOLN
INTRAMUSCULAR | Status: DC | PRN
  Administered 2013-11-20: 4 mg via INTRAVENOUS

## 2013-11-20 MED ORDER — DEXAMETHASONE SODIUM PHOSPHATE 4 MG/ML IJ SOLN
INTRAMUSCULAR | Status: DC | PRN
  Administered 2013-11-20: 8 mg via INTRAVENOUS

## 2013-11-20 MED ORDER — GLYCOPYRROLATE 0.2 MG/ML IJ SOLN
INTRAMUSCULAR | Status: AC
  Filled 2013-11-20: qty 2

## 2013-11-20 SURGICAL SUPPLY — 35 items
BRUSH CYTOL CELLEBRITY 1.5X140 (MISCELLANEOUS) ×3 IMPLANT
BRUSH SUPERTRAX BIOPSY (INSTRUMENTS) ×3 IMPLANT
BRUSH SUPERTRAX NDL-TIP CYTO (INSTRUMENTS) ×6 IMPLANT
CANISTER SUCTION 2500CC (MISCELLANEOUS) ×3 IMPLANT
CHANNEL WORK EXTEND EDGE 180 (KITS) IMPLANT
CHANNEL WORK EXTEND EDGE 45 (KITS) IMPLANT
CHANNEL WORK EXTEND EDGE 90 (KITS) IMPLANT
CONT SPEC 4OZ CLIKSEAL STRL BL (MISCELLANEOUS) ×3 IMPLANT
COVER TABLE BACK 60X90 (DRAPES) ×3 IMPLANT
FILTER STRAW FLUID ASPIR (MISCELLANEOUS) IMPLANT
FORCEPS BIOP SUPERTRX PREMAR (INSTRUMENTS) ×3 IMPLANT
GLOVE BIO SURGEON STRL SZ 6.5 (GLOVE) ×2 IMPLANT
GLOVE BIO SURGEONS STRL SZ 6.5 (GLOVE) ×1
GLOVE BIOGEL M STRL SZ7.5 (GLOVE) ×6 IMPLANT
GOWN STRL NON-REIN LRG LVL3 (GOWN DISPOSABLE) ×3 IMPLANT
KIT LOCATABLE GUIDE (CANNULA) IMPLANT
KIT MARKER FIDUCIAL DELIVERY (KITS) IMPLANT
KIT PROCEDURE EDGE 180 (KITS) IMPLANT
KIT PROCEDURE EDGE 45 (KITS) IMPLANT
KIT PROCEDURE EDGE 90 (KITS) ×3 IMPLANT
KIT ROOM TURNOVER OR (KITS) ×3 IMPLANT
MARKER SKIN DUAL TIP RULER LAB (MISCELLANEOUS) ×3 IMPLANT
NEEDLE SUPERTRX PREMARK BIOPSY (NEEDLE) ×3 IMPLANT
NS IRRIG 1000ML POUR BTL (IV SOLUTION) ×3 IMPLANT
OIL SILICONE PENTAX (PARTS (SERVICE/REPAIRS)) ×3 IMPLANT
PAD ARMBOARD 7.5X6 YLW CONV (MISCELLANEOUS) ×6 IMPLANT
PATCHES PATIENT (LABEL) ×3 IMPLANT
SPONGE GAUZE 4X4 12PLY (GAUZE/BANDAGES/DRESSINGS) ×3 IMPLANT
SYR 20CC LL (SYRINGE) ×3 IMPLANT
SYR 20ML ECCENTRIC (SYRINGE) ×3 IMPLANT
SYR 50ML SLIP (SYRINGE) IMPLANT
TOWEL OR 17X24 6PK STRL BLUE (TOWEL DISPOSABLE) ×3 IMPLANT
TRAP SPECIMEN MUCOUS 40CC (MISCELLANEOUS) ×3 IMPLANT
TUBE CONNECTING 12'X1/4 (SUCTIONS) ×1
TUBE CONNECTING 12X1/4 (SUCTIONS) ×2 IMPLANT

## 2013-11-20 NOTE — Interval H&P Note (Signed)
PCCM Interval Note  Presents for ENB today. Xarelto on hold  No new issues reported Filed Vitals:   11/20/13 0556 11/20/13 0603  BP:  178/49  Pulse:  72  Temp: 97.5 F (36.4 C)   TempSrc: Oral   Resp:  22  SpO2:  97%    Recent Labs Lab 11/13/13 1508  HGB 13.3  HCT 40.6  WBC 11.4*  PLT 343    Recent Labs Lab 11/13/13 1508  NA 142  K 4.3  CL 96  CO2 27  GLUCOSE 120*  BUN 23  CREATININE 1.00  CALCIUM 9.4    Recent Labs Lab 11/13/13 1508 11/20/13 0600  INR 1.58* 0.93   Plans:  - will proceed with ENB 3/19 am - all questions answered  Baltazar Apo, MD, PhD 11/20/2013, 7:16 AM [facility] [phone] or if no answer [phone]

## 2013-11-20 NOTE — Anesthesia Postprocedure Evaluation (Signed)
  Anesthesia Post-op Note  Patient: Corisa Montini  Procedure(s) Performed: Procedure(s): VIDEO BRONCHOSCOPY WITH ENDOBRONCHIAL NAVIGATION (N/A)  Patient Location: PACU  Anesthesia Type:General  Level of Consciousness: awake, alert , oriented and patient cooperative  Airway and Oxygen Therapy: Patient Spontanous Breathing  Post-op Pain: mild sore throat  Post-op Assessment: Post-op Vital signs reviewed, Patient's Cardiovascular Status Stable, Respiratory Function Stable, Patent Airway, No signs of Nausea or vomiting and Pain level controlled  Post-op Vital Signs: Reviewed and stable  Complications: No apparent anesthesia complications

## 2013-11-20 NOTE — Op Note (Signed)
Video Bronchoscopy with Electromagnetic Navigation Procedure Note  Date of Operation: 11/20/2013  Pre-op Diagnosis: RUL mass  Post-op Diagnosis: RUL mass  Surgeon: Baltazar Apo  Assistants: none  Anesthesia: General endotracheal anesthesia  Operation: Flexible video fiberoptic bronchoscopy with electromagnetic navigation and biopsies.  Estimated Blood Loss: 87GO  Complications: none apparent  Indications and History: Kara Holloway is a 80 y.o. female with a hx tobacco use, RA, HTN, A fib, remote PE. She has been followed by Dr Melvyn Novas for pulmonary nodules. A predominant nodule in the RUL has enlarged and is hypermetabolic on PET 1/'15, worrisome for malignancy. Recommendation was made to pursue biopsies by bronchoscopy with electromagnetic navigation. The risks, benefits, complications, treatment options and expected outcomes were discussed with the patient.  The possibilities of pneumothorax, pneumonia, reaction to medication, pulmonary aspiration, perforation of a viscus, bleeding, failure to diagnose a condition and creating a complication requiring transfusion or operation were discussed with the patient who freely signed the consent.    Description of Procedure: The patient was seen in the Preoperative Area, was examined and was deemed appropriate to proceed.  The patient was taken to OR 10, identified as Kara Holloway and the procedure verified as Flexible Video Fiberoptic Bronchoscopy.  A Time Out was held and the above information confirmed.   Prior to the date of the procedure a high-resolution CT scan of the chest was performed. Utilizing Bath a virtual tracheobronchial tree was generated to allow the creation of distinct navigation pathways to the patient's RUL abnormalities. After being taken to the operating room general anesthesia was initiated and the patient  was orally intubated. The video fiberoptic bronchoscope was introduced via the endotracheal tube and  a general inspection was performed which showed normal airways throughout. There were no endobronchial lesions seen. The extendable working channel and locator guide were introduced into the bronchoscope. The distinct navigation pathways prepared prior to this procedure were then utilized to navigate to within 0.5 - 1.2cm of patient's RUL lesion identified on CT scan. The extendable working channel was secured into place and the locator guide was withdrawn. Under fluoroscopic guidance transbronchial needle brushings, transbronchial Wang needle biopsies, and transbronchial forceps biopsies were performed to be sent for cytology and pathology.  At the end of the procedure a general airway inspection was performed and there was no evidence of active bleeding. The bronchoscope was removed.  The patient tolerated the procedure well. There was no significant blood loss and there were no obvious complications. A post-procedural chest x-ray is pending.  Samples: 1. Transbronchial needle brushings from RUL 2. Transbronchial Wang needle biopsies from RUL 3. Transbronchial forceps biopsies from RUL  Plans:  The patient will be discharged from the PACU to home when recovered from anesthesia and after chest x-ray is reviewed. We will review the cytology, pathology results with the patient when they become available. Outpatient followup will be with Dr Lamonte Sakai or Dr Melvyn Novas.    Baltazar Apo, MD, PhD 11/20/2013, 9:17 AM [facility] [phone] or if no answer [phone]

## 2013-11-20 NOTE — Preoperative (Signed)
Beta Blockers   Reason not to administer Beta Blockers:Not Applicable, patient took Metoprolol 11/20/13.

## 2013-11-20 NOTE — Anesthesia Procedure Notes (Signed)
Procedure Name: Intubation Date/Time: 11/20/2013 7:50 AM Performed by: Jacob Moores Pre-anesthesia Checklist: Patient identified, Emergency Drugs available, Suction available and Patient being monitored Patient Re-evaluated:Patient Re-evaluated prior to inductionOxygen Delivery Method: Circle system utilized Preoxygenation: Pre-oxygenation with 100% oxygen Intubation Type: IV induction Ventilation: Mask ventilation without difficulty Laryngoscope Size: Miller and 2 Grade View: Grade II Tube type: Oral Tube size: 8.5 mm Number of attempts: 1 Airway Equipment and Method: Stylet and LTA kit utilized Placement Confirmation: ETT inserted through vocal cords under direct vision,  positive ETCO2 and breath sounds checked- equal and bilateral Secured at: 22 cm Tube secured with: Tape Dental Injury: Teeth and Oropharynx as per pre-operative assessment

## 2013-11-20 NOTE — Anesthesia Preprocedure Evaluation (Addendum)
Anesthesia Evaluation  Patient identified by MRN, date of birth, ID band Patient awake    Reviewed: Allergy & Precautions, H&P , NPO status , Patient's Chart, lab work & pertinent test results, reviewed documented beta blocker date and time   History of Anesthesia Complications Negative for: history of anesthetic complications  Airway Mallampati: II TM Distance: <3 FB Neck ROM: Full    Dental  (+) Dental Advisory Given, Teeth Intact, Partial Upper   Pulmonary shortness of breath and with exertion, COPD COPD inhaler, former smoker (quit 1989), PE Multiple lung nodules   + wheezing (pre op MDI)      Cardiovascular hypertension, Pt. on home beta blockers and Pt. on medications - angina+ CAD ('05 cath: RCA stent, otherwise non-obstr), + Cardiac Stents (RCA stent), + Peripheral Vascular Disease (h/o AAA) and +CHF + dysrhythmias (xarelto) Atrial Fibrillation Rhythm:Irregular Rate:Normal  Echo on 07/01/10 [facility]) showed: Normal LV systolic function, EF 93% (visulaly estimated 60-65%), moderately dilated LA, mildly dilated RA, sclerotic AV with normal valve excursion. A gradient was noted in the LV outflow tract on apical views due to overlap with MR, trivial AR, Mild MR/TR.   Neuro/Psych negative neurological ROS     GI/Hepatic negative GI ROS, Neg liver ROS,   Endo/Other  negative endocrine ROS  Renal/GU negative Renal ROS     Musculoskeletal  (+) Arthritis -,   Abdominal   Peds  Hematology INR 0.93   Anesthesia Other Findings   Reproductive/Obstetrics                      Anesthesia Physical Anesthesia Plan  ASA: III  Anesthesia Plan: General   Post-op Pain Management:    Induction: Intravenous  Airway Management Planned: Oral ETT  Additional Equipment:   Intra-op Plan:   Post-operative Plan: Extubation in OR  Informed Consent: I have reviewed the patients History and Physical, chart, labs and  discussed the procedure including the risks, benefits and alternatives for the proposed anesthesia with the patient or authorized representative who has indicated his/her understanding and acceptance.   Dental advisory given  Plan Discussed with: CRNA, Anesthesiologist and Surgeon  Anesthesia Plan Comments: (Plan routine monitors, GETA)       Anesthesia Quick Evaluation

## 2013-11-20 NOTE — Transfer of Care (Signed)
Immediate Anesthesia Transfer of Care Note  Patient: Kara Holloway  Procedure(s) Performed: Procedure(s): VIDEO BRONCHOSCOPY WITH ENDOBRONCHIAL NAVIGATION (N/A)  Patient Location: PACU  Anesthesia Type:General  Level of Consciousness: awake and alert   Airway & Oxygen Therapy: Patient Spontanous Breathing and Patient connected to nasal cannula oxygen  Post-op Assessment: Report given to PACU RN, Post -op Vital signs reviewed and stable and Patient moving all extremities X 4  Post vital signs: Reviewed and stable  Complications: No apparent anesthesia complications

## 2013-11-20 NOTE — Discharge Instructions (Signed)
Flexible Bronchoscopy, Care After These instructions give you information on caring for yourself after your procedure. Your doctor may also give you more specific instructions. Call your doctor if you have any problems or questions after your procedure. HOME CARE  Do not eat or drink anything for 2 hours after your procedure. If you try to eat or drink before the medicine wears off, food or drink could go into your lungs. You could also burn yourself.  After 2 hours have passed and when you can cough and gag normally, you may eat soft food and drink liquids slowly.  The day after the test, you may eat your normal diet.  You may do your normal activities.  Keep all doctor visits. GET HELP RIGHT AWAY IF:  You get more and more short of breath.  You get lightheaded.  You feel like you are going to pass out (faint).  You have chest pain.  You have new problems that worry you.  You cough up more than a little blood.  You cough up more blood than before. MAKE SURE YOU:  Understand these instructions.  Will watch your condition.  Will get help right away if you are not doing well or get worse. Document Released: 06/18/2009 Document Revised: 06/11/2013 Document Reviewed: 04/25/2013 [facility] Patient Information 2014 [place].  MEDICATION NOTE:  You may restart your Xarelto 20mg  daily on 11/22/2013.   Please call our office for any questions or concerns. [phone].  What to eat:  For your first meals, you should eat lightly; only small meals initially.  If you do not have nausea, you may eat larger meals.  Avoid spicy, greasy and heavy food.    General Anesthesia, Adult, Care After  Refer to this sheet in the next few weeks. These instructions provide you with information on caring for yourself after your procedure. Your health care provider may also give you more specific instructions. Your treatment has been planned according to current medical practices, but problems  sometimes occur. Call your health care provider if you have any problems or questions after your procedure.  WHAT TO EXPECT AFTER THE PROCEDURE  After the procedure, it is typical to experience:  Sleepiness.  Nausea and vomiting. HOME CARE INSTRUCTIONS  For the first 24 hours after general anesthesia:  Have a responsible person with you.  Do not drive a car. If you are alone, do not take public transportation.  Do not drink alcohol.  Do not take medicine that has not been prescribed by your health care provider.  Do not sign important papers or make important decisions.  You may resume a normal diet and activities as directed by your health care provider.  Change bandages (dressings) as directed.  If you have questions or problems that seem related to general anesthesia, call the hospital and ask for the anesthetist or anesthesiologist on call. SEEK MEDICAL CARE IF:  You have nausea and vomiting that continue the day after anesthesia.  You develop a rash. SEEK IMMEDIATE MEDICAL CARE IF:  You have difficulty breathing.  You have chest pain.  You have any allergic problems. Document Released: 11/27/2000 Document Revised: 04/23/2013 Document Reviewed: 03/06/2013  [facility] Patient Information 2014 [place], [place].

## 2013-11-20 NOTE — H&P (View-Only) (Signed)
Subjective:    Patient ID: Kara Holloway, female    DOB: 1926/02/10, 80 y.o.   MRN: [id number]  HPI 80 yo former smoker, 60 pk-yrs, HTN, A Fib, remote PE. She has been dx with RA in the past, has had pulm nodules followed. She has had an increase in size in these and now has PET positive scan in 2/'15. FOB was non-diagnostic. She presents today to discuss another bx attempt using ENB.    Review of Systems  Constitutional: Negative for fever and unexpected weight change.  HENT: Negative for congestion, dental problem, ear pain, nosebleeds, postnasal drip, rhinorrhea, sinus pressure, sneezing, sore throat and trouble swallowing.   Eyes: Negative for redness and itching.  Respiratory: Positive for cough and shortness of breath. Negative for chest tightness and wheezing.   Cardiovascular: Negative for palpitations and leg swelling.  Gastrointestinal: Negative for nausea and vomiting.  Genitourinary: Negative for dysuria.  Musculoskeletal: Negative for joint swelling.  Skin: Negative for rash.  Neurological: Negative for headaches.  Hematological: Bruises/bleeds easily.  Psychiatric/Behavioral: Negative for dysphoric mood. The patient is not nervous/anxious.      Past Medical History  Diagnosis Date  . Osteoporosis   . Kidney disease   . Chronic airway obstruction   . Anemia   . A-fib   . Embolism, pulmonary with infarction   . Hypercholesteremia   . HTN (hypertension)   . Atherosclerosis   . HA (headache)   . Septic arthritis of hip      right hip  . Dysrhythmia   . AAA (abdominal aortic aneurysm)   . Bone/cartilage disorder      Family History  Problem Relation Age of Onset  . Cervical cancer Sister   . Breast cancer Daughter   . Throat cancer Brother   . Coronary artery disease      Uncle  . Heart attack      Uncle  . Stomach cancer Father   . Asthma Son      History   Social History  . Marital Status: Widowed    Spouse Name: N/A    Number of Children: 3  .  Years of Education: N/A   Occupational History  . Retired    Social History Main Topics  . Smoking status: Former Smoker -- 1.50 packs/day for 40 years    Types: Cigarettes    Quit date: 09/05/1987  . Smokeless tobacco: Never Used  . Alcohol Use: Yes     Comment: one glass wine daily  . Drug Use: No  . Sexual Activity: Not on file   Other Topics Concern  . Not on file   Social History Narrative  . No narrative on file     Allergies  Allergen Reactions  . Erythromycin Hives and Swelling  . Shellfish Allergy Hives     Outpatient Prescriptions Prior to Visit  Medication Sig Dispense Refill  . acetaminophen (TYLENOL) 650 MG CR tablet Take 650 mg by mouth every 8 (eight) hours as needed.      Marland Kitchen alendronate (FOSAMAX) 70 MG tablet Take 70 mg by mouth every 7 (seven) days. Take with a full glass of water on an empty stomach.      Marland Kitchen amiodarone (PACERONE) 100 MG tablet Take 50 mg by mouth daily.      Marland Kitchen atorvastatin (LIPITOR) 10 MG tablet Take 10 mg by mouth daily.      . ferrous sulfate 325 (65 FE) MG tablet Take 325 mg by mouth daily with breakfast.      .  Fluticasone-Salmeterol (ADVAIR DISKUS) 250-50 MCG/DOSE AEPB Inhale 1 puff into the lungs daily.      . furosemide (LASIX) 40 MG tablet Take 40 mg by mouth daily.      . Ipratropium-Albuterol (COMBIVENT IN) As directed as needed      . metoprolol tartrate (LOPRESSOR) 25 MG tablet Take 25 mg by mouth daily.      . Multiple Vitamin (MULTIVITAMIN) capsule Take 1 capsule by mouth daily.      . predniSONE (DELTASONE) 5 MG tablet Use as directed  100 tablet  2  . senna (SENOKOT) 8.6 MG tablet Take 1 tablet by mouth daily.      Marland Kitchen tiotropium (SPIRIVA) 18 MCG inhalation capsule Place 18 mcg into inhaler and inhale daily.      . Wheat Dextrin (BENEFIBER DRINK MIX PO) Daily as needed      . vitamin B-12 (CYANOCOBALAMIN) 250 MCG tablet Take 250 mcg by mouth daily.       No facility-administered medications prior to visit.    25/15 --   COMPARISON: DG CHEST 2 VIEW dated 09/18/2013; CT CHEST W/ CM dated  04/05/2012; DG CHEST 2 VIEW dated 05/03/2012  FINDINGS:  NECK  No hypermetabolic lymph nodes in the neck.  Exophytic right posterior thyroid lobe nodule from the left lobe,  2.0 x 2.7 cm, not hypermetabolic, but causing some mass effect on  the adjacent esophagus.  CHEST  Spiculated right upper lobe mass, 3.0 x 2.8 by 3.6 cm, maximum  standard uptake value 22.8. This lesion is bilobed and appears to  represent an amalgamation or confluence of the two prior right upper  lobe nodules which previously measured 1.6 x 1.0 and 1.3 x 1.3 cm.  Right lower of paratracheal lymph node, maximum standard uptake  value 3.7, mildly hypermetabolic.  Atherosclerotic aortic arch and coronary arteries.  Right basilar pulmonary nodules are resolved. Left basilar pulmonary  nodules appear stable from 04/05/2012. Right posterior  hemidiaphragmatic herniation contains adipose tissue.  ABDOMEN/PELVIS  No abnormal hypermetabolic activity within the liver, pancreas,  adrenal glands, or spleen. No hypermetabolic lymph nodes in the  abdomen or pelvis.  4.3 x 4.4 cm infrarenal fusiform abdominal aortic aneurysm. Dense  aortoiliac atherosclerotic calcification. Exophytic cyst from the  left kidney upper pole precontrast hyperdense 0.7 cm left renal  lesion, likely a complex cyst although technically nonspecific.  Exophytic photopenic 2.7 cm cyst from the left kidney lower pole.  Prominent stool throughout the colon favors constipation. Photopenic  1.6 cm left ovarian fluid density lesion.  SKELETON  No focal hypermetabolic activity to suggest skeletal metastasis.  Right hip Knowles type pins. Old left pelvic fractures appear  healed.  IMPRESSION:  1. The two right upper lobe nodules appear to have coalesced into a  3.6 cm right upper lobe mass with maximum standard uptake value  22.8, compatible with malignancy. Mildly hypermetabolic right   paratracheal lymph node is considered positive for ipsilateral  metastatic disease. No additional metastatic disease identified.  2. 4.4 cm infrarenal abdominal aortic aneurysm.  3. Atherosclerosis.  4. Prominent stool throughout the colon favors constipation.  5. Small left ovarian cyst does not require further follow-up.  6. Exophytic left posterior thyroid nodule is not hypermetabolic but  does have extrinsic mass effect on the esophagus.      Objective:   Physical Exam Filed Vitals:   11/06/13 1428  BP: 140/70  Pulse: 84  Height: 5\' 2"  (1.575 m)  Weight: 132 lb 9.6 oz (60.147 kg)  SpO2: 93%   Gen: Pleasant, well-nourished, in no distress,  normal affect  ENT: No lesions,  mouth clear,  oropharynx clear, no postnasal drip  Neck: No JVD, no TMG, no carotid bruits  Lungs: No use of accessory muscles, no dullness to percussion, clear without rales or rhonchi  Cardiovascular: RRR, heart sounds normal, no murmur or gallops, no peripheral edema  Musculoskeletal: No deformities, no cyanosis or clubbing  Neuro: alert, non focal  Skin: Warm, no lesions or rashes     Assessment & Plan:  Multiple pulmonary nodules Agree that this likely represents malignancy. Discussed ENB with her today. She agrees to proceed. Will try to arrange.  - needs CT scan super D - will set up ENB, they would prefer a Thursday - will need to hold xarelto x 2 days -

## 2013-11-21 ENCOUNTER — Encounter (HOSPITAL_COMMUNITY): Payer: Self-pay | Admitting: Emergency Medicine

## 2013-11-25 ENCOUNTER — Other Ambulatory Visit: Payer: Self-pay | Admitting: Internal Medicine

## 2013-11-25 DIAGNOSIS — R918 Other nonspecific abnormal finding of lung field: Secondary | ICD-10-CM

## 2013-11-26 ENCOUNTER — Ambulatory Visit: Payer: Medicare Other | Admitting: Internal Medicine

## 2013-11-27 ENCOUNTER — Ambulatory Visit (INDEPENDENT_AMBULATORY_CARE_PROVIDER_SITE_OTHER): Payer: Medicare Other | Admitting: Internal Medicine

## 2013-11-27 ENCOUNTER — Encounter: Payer: Self-pay | Admitting: Internal Medicine

## 2013-11-27 VITALS — BP 132/60 | HR 66 | Temp 97.6°F | Ht 62.0 in | Wt 133.8 lb

## 2013-11-27 DIAGNOSIS — J449 Chronic obstructive pulmonary disease, unspecified: Secondary | ICD-10-CM

## 2013-11-27 DIAGNOSIS — C3410 Malignant neoplasm of upper lobe, unspecified bronchus or lung: Secondary | ICD-10-CM

## 2013-11-27 NOTE — Assessment & Plan Note (Signed)
-  CT chest 04/05/12  Spiculated nodule in the right upper lobe measures 1.6 x 1 cm.  A second nodule in the right upper lobe measures 1.3 x 1.2 cm   Small nodules are noted bilateral lower lobe and right upper lobe anteriorly. Metastatic disease cannot be excluded.  -CXR 05/03/2012 13 mm spiculated lesion in the right upper lobe. 15 mm  more lobular nodule superior lateral to the right hilum t  lung clear.   - Cxr 07/11/2012 no change multiple rul nodules - CXR  12/19/2012 :  Progression in size of nodule in the right upper lobe (referring to the most inferior of the nodules) > RUL 2.5 cm  06/04/13   - see ov 06/19/2013 > declines PET/bx until p New Year's - PET 10/09/2013 > 1. The two right upper lobe nodules appear to have coalesced into a 3.6 cm right upper lobe mass with maximum standard uptake value 22.8, compatible with malignancy. Mildly hypermetabolic right paratracheal lymph node is considered positive for ipsilateral metastatic disease. 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

## 2013-11-27 NOTE — Progress Notes (Signed)
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  On Prednisone x one month and much better. Breathing is fine = doe x fast walk at a mall, steps ok but stops at top on one flight rec Please schedule a follow up office visit in 4-6 weeks, call sooner if needed with PFT's and  bring [place] chest [place] you > "I had them sent"    06/19/2013 f/u ov/Tylesha Gibeault re: mpns/ RA/ Chief Complaint  Patient presents with  . Follow-up    Pt states that her breathing is doing well. She states that last time she needed combivent for rescue was approx 3 wks ago.   Arthritis is slt worse no change pred @ 5 mg daily  No skin nodules Sob one side of house to other on advair/ spiriva and prn combivent.  No hemoptysis rec No change rx  09/18/2013 f/u ov/Shelba Susi re: MPN/ RA Chief Complaint  Patient presents with  . Follow-up    Pt states breathing is progressivley worse since her last visit. She uses combivent once every 2-3 wks.      Arthritis is worse on 5 mg per day maint and wants to know why she can't take more. Doe x more than slow adls on advair now bid (helps a little in the am to take the pm dose) and spiriva qam rec Prednisone 20 mg per day until arthritis and breathing better, then 10 mg per day for a week, then try 10 alternating with 5 for a week, then 5 mg daily Please see patient coordinator before you leave today  to schedule PET scan and I will call with results when available  with recommendations for next step   11/27/2013 f/u ov/Brunilda Eble re: floor of 10 mg per day for arthritis = sob  Chief Complaint  Patient presents with  . Follow-up    sob staying the same with exertion,occass. wheezing, no cough,denies cp or tightness, no fcs    Ok at rest/ no 02 required, sob with more than slow adls  No obvious day to day or daytime variabilty or assoc chronic cough or cp or chest tightness, subjective wheeze overt sinus or hb symptoms. No unusual exp hx or h/o childhood pna/ asthma or knowledge of premature birth.  Sleeping ok without nocturnal  or early am exacerbation  of respiratory  c/o's or need for noct saba. Also denies any obvious fluctuation of symptoms with weather or environmental changes or other aggravating or alleviating factors except as outlined above   Current Medications, Allergies, Complete Past Medical History, Past Surgical History, Family History, and Social History were reviewed in [employer] record.  ROS  The following are not active complaints unless bolded sore throat, dysphagia, dental problems, itching, sneezing,  nasal congestion or excess/ purulent secretions, ear ache,   fever, chills, sweats, unintended wt loss, pleuritic or exertional cp, hemoptysis,  orthopnea pnd or leg swelling, presyncope, palpitations, heartburn, abdominal pain, anorexia, nausea, vomiting, diarrhea  or change in bowel or urinary habits, change in stools or urine, dysuria,hematuria,  rash, arthralgias, visual complaints, headache, numbness weakness or ataxia or problems with walking or coordination,  change in mood/affect or memory.           Objective:   Physical Exam  Wt 118 05/03/2012 > 122 07/11/2012 >  128 12/19/2012 > 129 06/19/2013 > 09/18/2013 132> 11/27/2013  133  Frail but quite pleasant mildly cushingnoid amb wf nad    HEENT mild turbinate edema.  Oropharynx no thrush or excess pnd or cobblestoning.  No JVD or cervical adenopathy. Mild  accessory muscle hypertrophy. Trachea midline, nl thryroid. Chest was hyperinflated by percussion with diminished breath sounds and moderate increased exp time without wheeze. Hoover sign positive at mid inspiration. Regular rate and rhythm without murmur gallop or rub or increase P2 or edema.  Abd: no hsm, nl excursion. Ext warm without cyanosis or clubbing.  ? RA nodule vs ganglio cyst L index finger, no other nodules                 Assessment & Plan:   Outpatient Encounter Prescriptions as of 11/27/2013  Medication Sig  . acetaminophen (TYLENOL) 650 MG CR tablet Take 650 mg by mouth daily.   Marland Kitchen alendronate (FOSAMAX) 70 MG tablet Take 70 mg by mouth every 7 (seven) days. On Sunday  Take with a full glass of water on an empty stomach.  Marland Kitchen amiodarone (PACERONE) 100 MG tablet Take 50 mg by mouth daily.  Marland Kitchen atorvastatin (LIPITOR) 10 MG tablet Take 10 mg by mouth daily.  . Fluticasone-Salmeterol (ADVAIR DISKUS) 250-50 MCG/DOSE AEPB Inhale 1 puff into the lungs daily.  . furosemide (LASIX) 40 MG tablet Take 40 mg by mouth daily.  . Ipratropium-Albuterol (COMBIVENT IN) Inhale 1 puff into the lungs daily as needed (shortness of breath). As directed as needed  . metoprolol tartrate (LOPRESSOR) 25 MG tablet Take 25 mg by mouth daily.  . Multiple Vitamin (MULTIVITAMIN) capsule Take 1 capsule by mouth daily.  . predniSONE (DELTASONE) 5 MG tablet Take 10 mg by mouth daily with breakfast.   . rivaroxaban (XARELTO) 10 MG TABS tablet Take 20 mg by mouth daily.  Marland Kitchen tiotropium (SPIRIVA) 18 MCG inhalation capsule Place 18 mcg into inhaler and inhale daily.  . Wheat Dextrin (BENEFIBER DRINK MIX PO) Take 1 scoop by mouth daily. In 6oz of juice and drink

## 2013-11-27 NOTE — Assessment & Plan Note (Addendum)
-   PFT's 07/11/2012  FEV1 0.69 (43%) ratio 36 and no better with B2 and DLCO 44 corrects to 69  Adequate control on present rx, reviewed > no change in rx needed  - she is prednisone dependent for RA and copd with a floor apparently of about 10 mg per day.

## 2013-11-27 NOTE — Patient Instructions (Addendum)
advair 250 twice daily  spiriva once a day Ok to use combivent one puff every 6 hours as needed  Follow up here is as needed - we can see you in 3 months in the [facility]

## 2013-12-06 LAB — AFB CULTURE WITH SMEAR (NOT AT ARMC)
Acid Fast Smear: NONE SEEN
SPECIAL REQUESTS: NORMAL

## 2014-02-02 ENCOUNTER — Other Ambulatory Visit: Payer: Self-pay | Admitting: Internal Medicine

## 2014-05-05 ENCOUNTER — Institutional Professional Consult (permissible substitution): Payer: Medicare Other | Admitting: Critical Care Medicine

## 2014-05-05 DEATH — deceased

## 2014-12-31 IMAGING — CR DG CHEST 1V PORT
1 series · 1 of 1 positions shown · non-contrast
Comparison: NM PET IMAGE INITIAL (PI) SKULL BASE TO THIGH dated
10/09/2013; DG CHEST 2 VIEW dated 09/18/2013; DG CHEST 2V dated
06/04/2013

CLINICAL DATA: Status post bronchoscopy

EXAM:
PORTABLE CHEST - 1 VIEW

[AP]
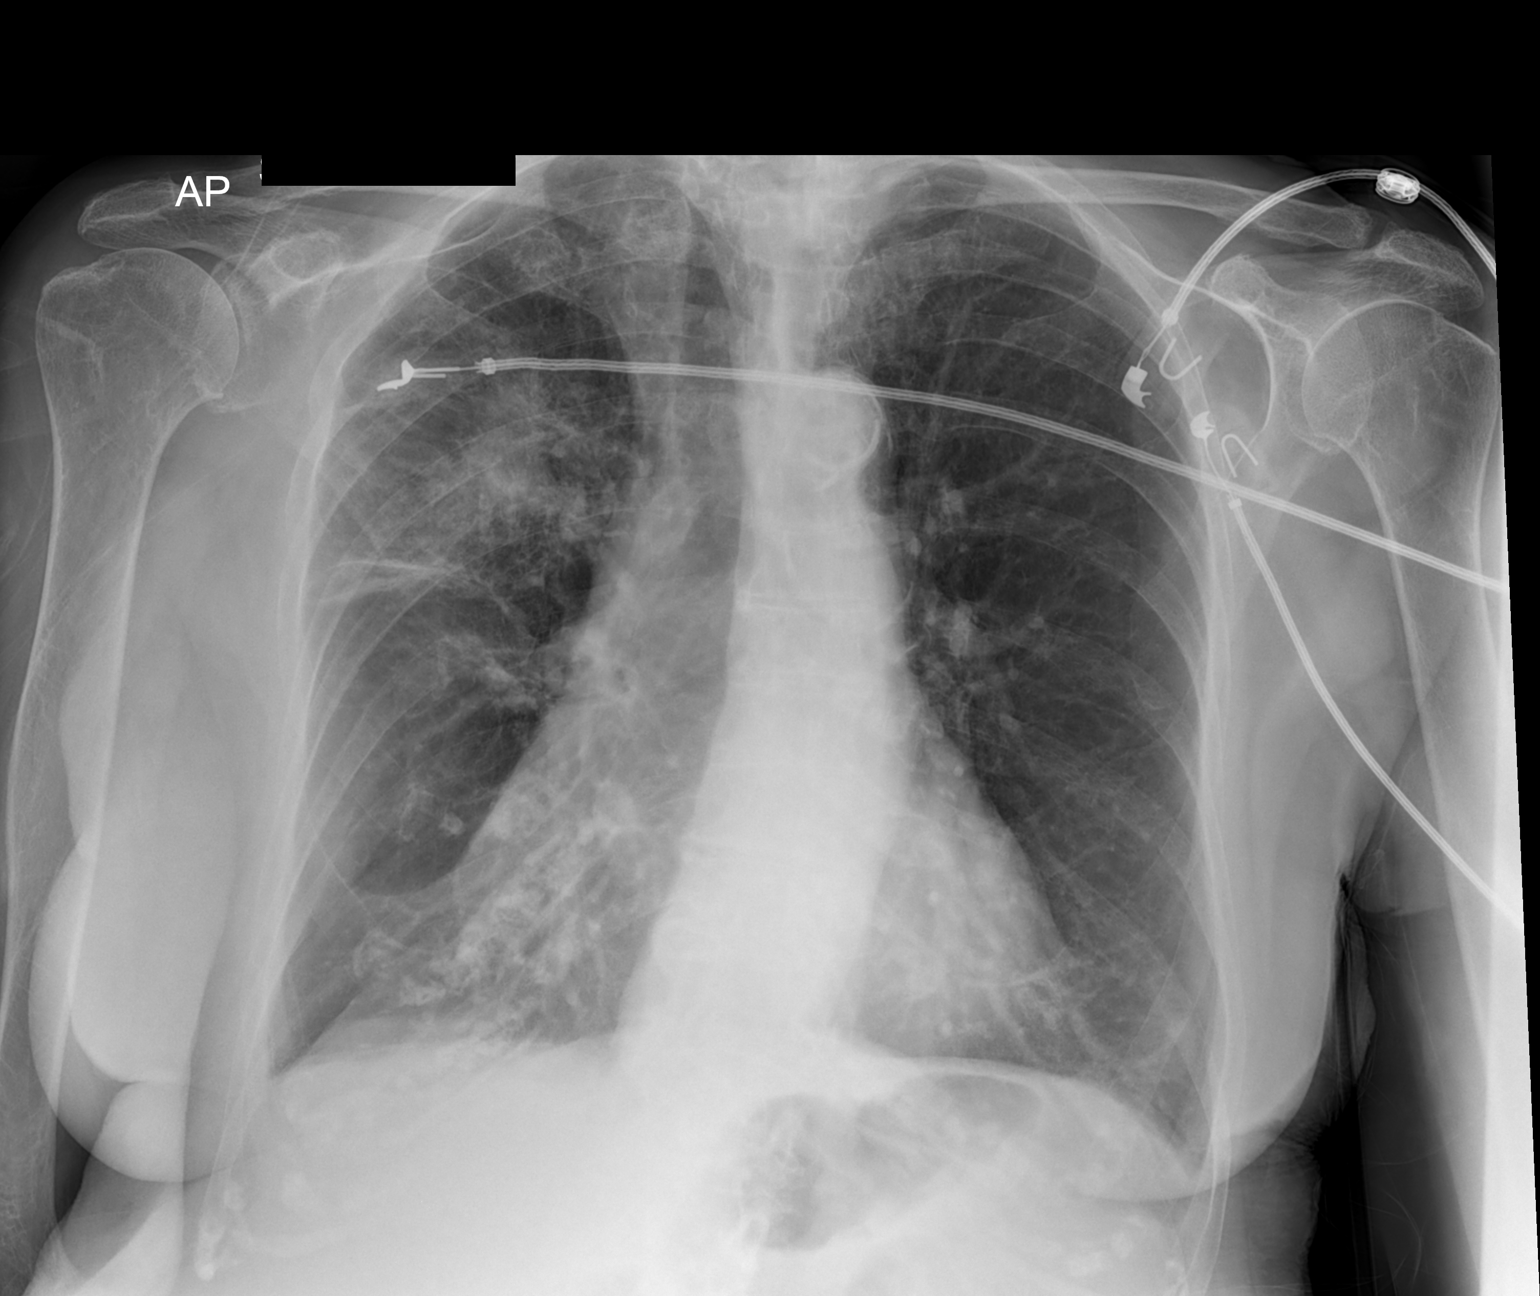

[1 of 1 positions shown; findings below may reference images not displayed]

FINDINGS: Heart size upper normal. Aortic calcification. Vascular pattern
normal. Left lung is clear.

On the right side, there is hazy density in the upper lobe measuring
about 41 x 55 mm, superimposed over the area of nodular densities
seen on prior chest radiograph. This is consistent with post
bronchoscopy change. There is no pneumothorax. There is no pleural
effusion.
IMPRESSION: Right upper lobe post bronchoscopy change.  No pneumothorax.

## 2015-01-28 IMAGING — CT CT CHEST SUPER D W/O CM
2 of 4 series · 15 of 36 positions shown, 18 images · IV contrast (Omnipaque 300)
Comparison: DG CHEST 1V PORT dated 10/16/2013;

CLINICAL DATA: Right upper lobe lung nodules.  New lung mass.

EXAM:
CT CHEST WITHOUT CONTRAST
TECHNIQUE: Multidetector CT imaging of the chest was performed using thin slice
collimation for electromagnetic bronchoscopy planning purposes,
without intravenous contrast.

[Series 3: lung · axial · 0.59mm/px · z∈[-306,-61]mm · 12 of 59 slices shown, 15 images]
[im 5/59  mediastinal]
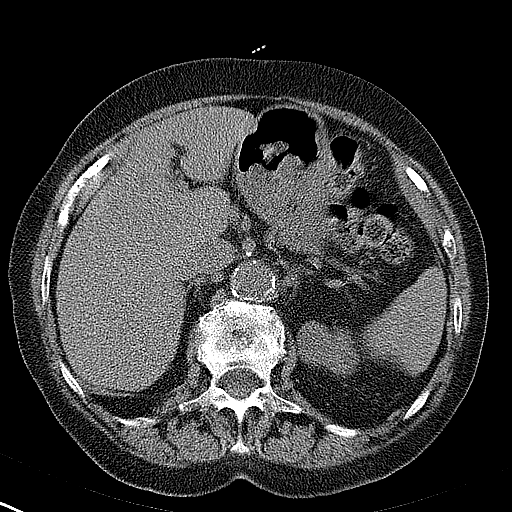
[im 5/59  lung]
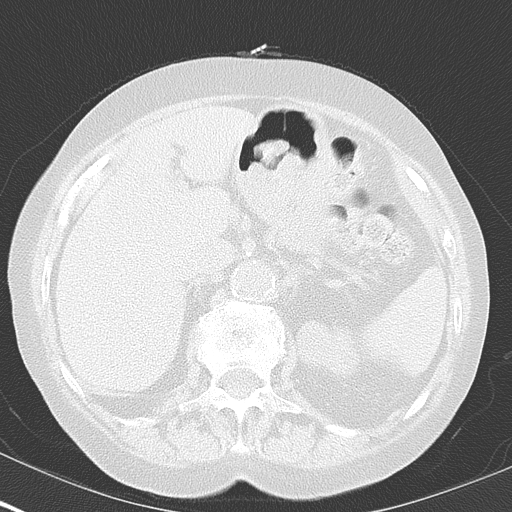
[im 9/59  lung]
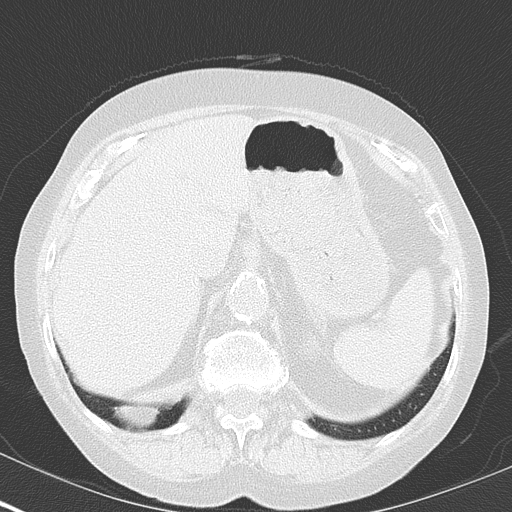
[im 14/59  lung]
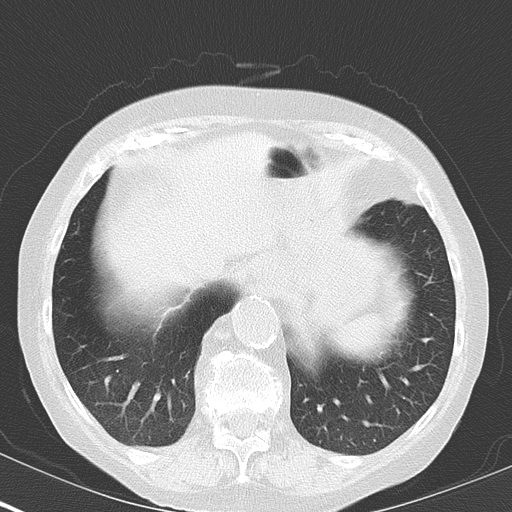
[im 18/59  lung]
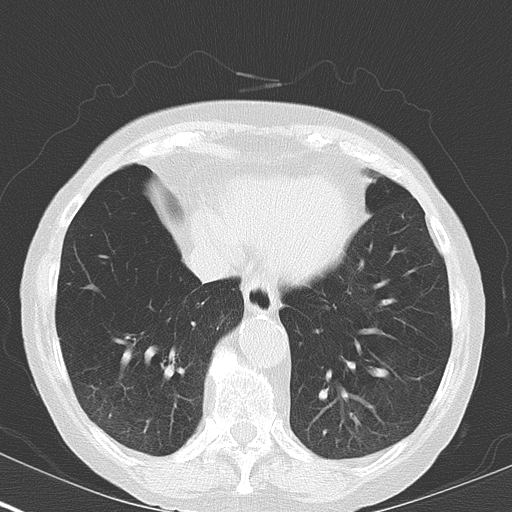
[im 23/59  mediastinal]
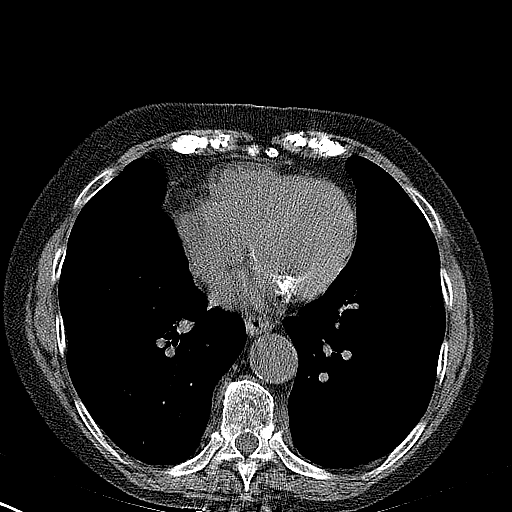
[im 23/59  lung]
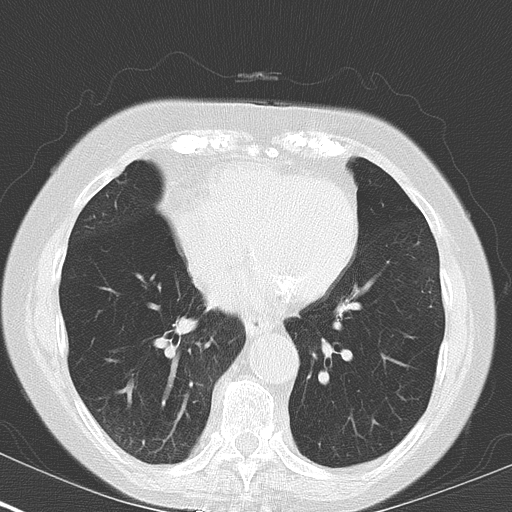
[im 27/59  lung]
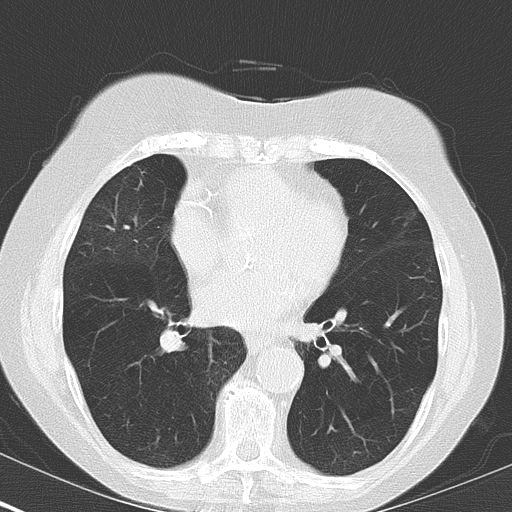
[im 32/59  lung]
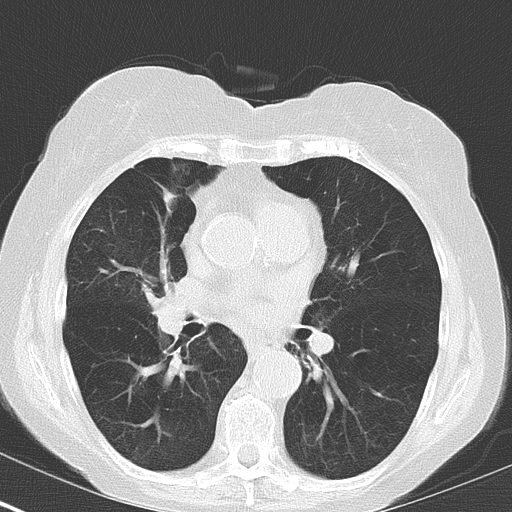
[im 36/59  lung]
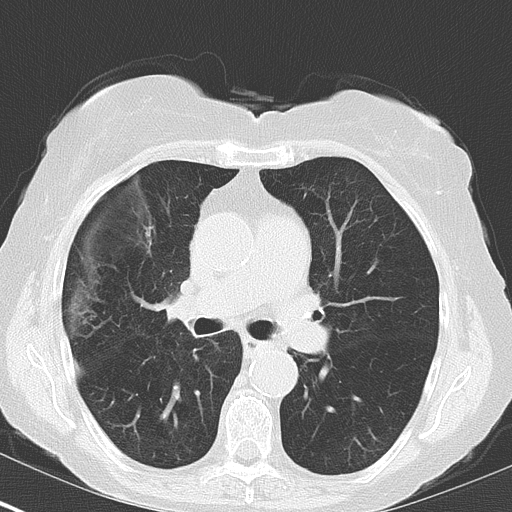
[im 41/59  mediastinal]
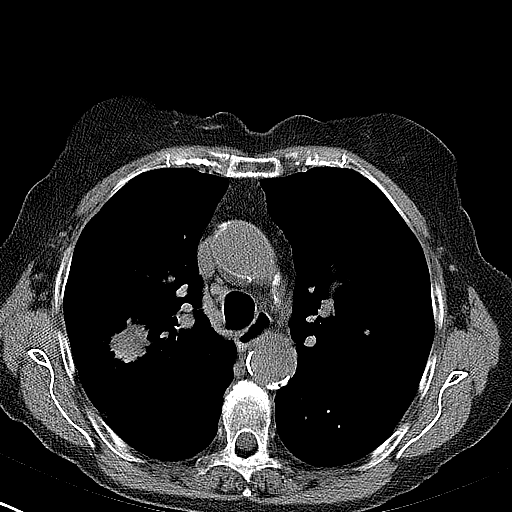
[im 41/59  lung]
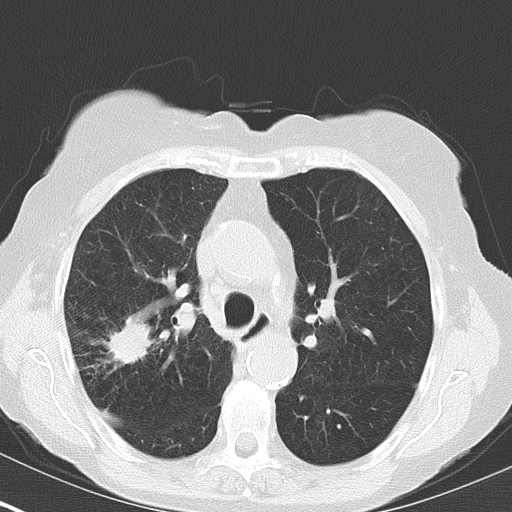
[im 45/59  lung]
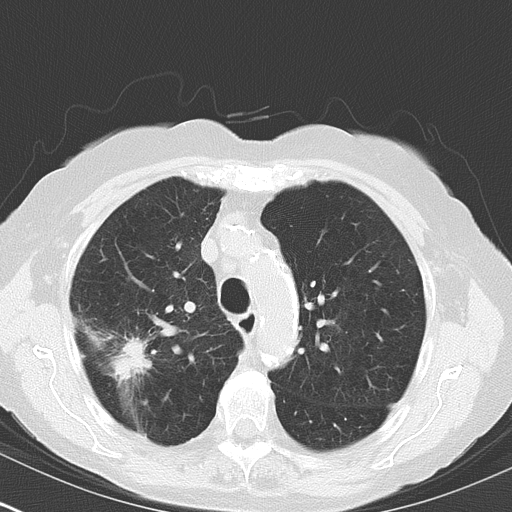
[im 50/59  lung]
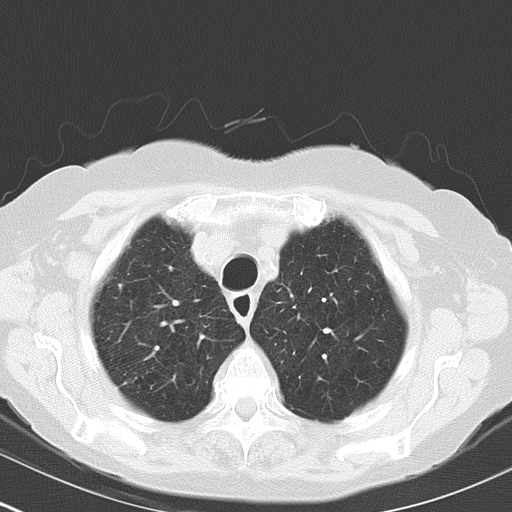
[im 54/59  lung]
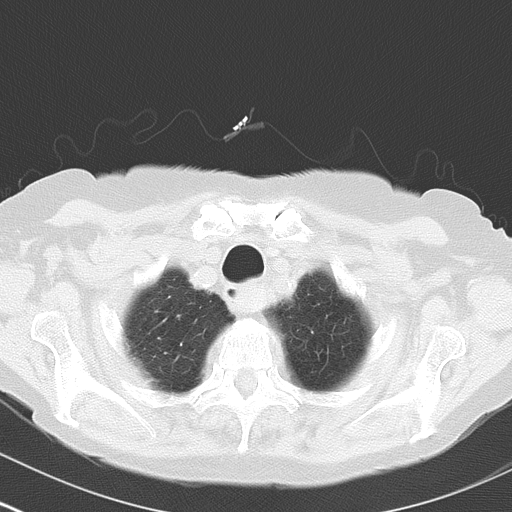

[Series 602: cor · coronal · 0.59mm/px · 3 of 104 slices shown]
[im 21/104  lung]
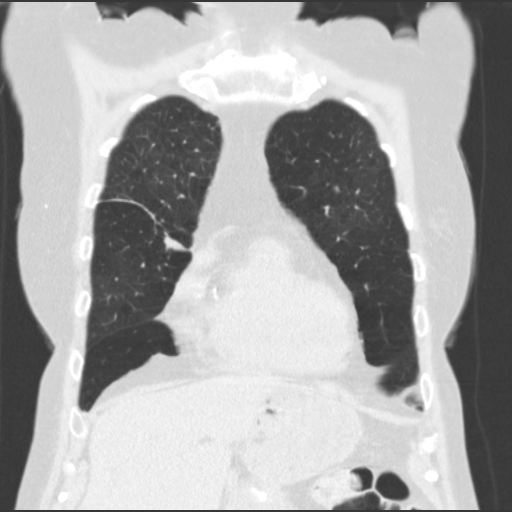
[im 42/104  lung]
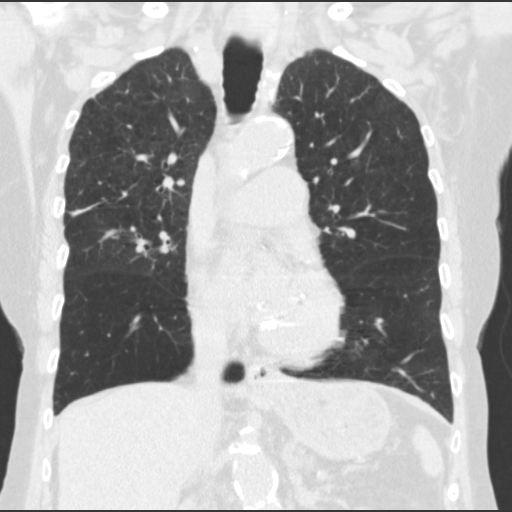
[im 62/104  lung]
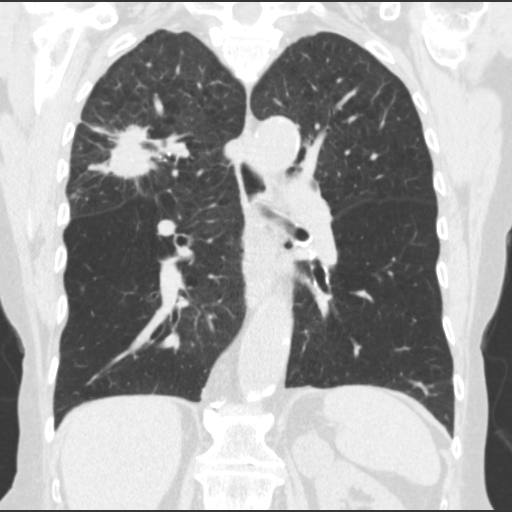

[15 of 36 positions shown; findings below may reference images not displayed]

NM PET IMAGE INITIAL
(PI) SKULL BASE TO THIGH dated 10/09/2013; CT CHEST W/ CM dated
04/05/2012
FINDINGS: Lungs/Pleura: Moderate centrilobular emphysema. Probable scarring in
the anterior right upper lobe, chronic. Minimal right lower lobe
nodularity on image 36/series 3. This is similar to the 1206 exam,
consistent with a benign etiology.

6 mm left lower lobe lung nodule on image 44 was similar in 1206.
Mild nodularity more medially in the left lower lobe is also chronic
and therefore benign.

Right upper lobe bilobed lung lesion. This mass measures 2.6 by
cm on image 16/series 13. 3.3 cm on sagittal image 29.

No pleural fluid.

Heart/Mediastinum: Posterior left thyroid nodule which is positioned
in a retrotracheal location. 2.0 x 2.7 cm and slightly enlarged from
1206. 2.3 x 2.1 cm on that study it. This again displaces the
esophagus to the right.

Aortic and branch vessel atherosclerosis. Mild cardiomegaly with
advanced coronary artery atherosclerosis. No pericardial effusion. A
right paratracheal node measures 8 mm and is mildly hypermetabolic
at PET. This is likely enlarged from 6 mm on the 1206 study. Hilar
regions poorly evaluated without intravenous contrast. Pulmonary
artery enlargement, with the outflow tract measuring 3.2 cm.
Right-sided fat containing Bochdalek's hernia.

Upper Abdomen: Cholecystectomy. An upper pole left renal lesion
measures slightly greater than fluid density at 22 HU. 2.7 cm today,
similar in size to the 1206 study. Normal adrenal glands.
Incompletely imaged infrarenal abdominal aortic aneurysm.
Cholecystectomy.

Bones/Musculoskeletal:  No acute osseous abnormality.
IMPRESSION: 1. Right upper lobe primary bronchogenic carcinoma. A small right
paratracheal node corresponds to mild hypermetabolism at PET. This
is suspicious but technically indeterminate.
2. Other smaller pulmonary nodules which are similar to the 1206
exam and therefore favored to be benign.
3. Left renal lesion which is likely a minimally complex cyst,
similar to 1206.
4. Incompletely imaged infrarenal abdominal aortic aneurysm.
5. Pulmonary artery enlargement suggests pulmonary arterial
hypertension.
6. Posterior left-sided thyroid nodule, minimally enlarged from
1206. This causes mass effect upon the right side of the esophagus.

## 2015-02-04 IMAGING — CR DG CHEST 1V PORT
1 series · 1 of 1 positions shown · non-contrast
Comparison: DG CHEST 2 VIEW dated 11/20/2013;

CLINICAL DATA: s/p bronchoscopy with R biopsies

EXAM:
PORTABLE CHEST - 1 VIEW

[AP]
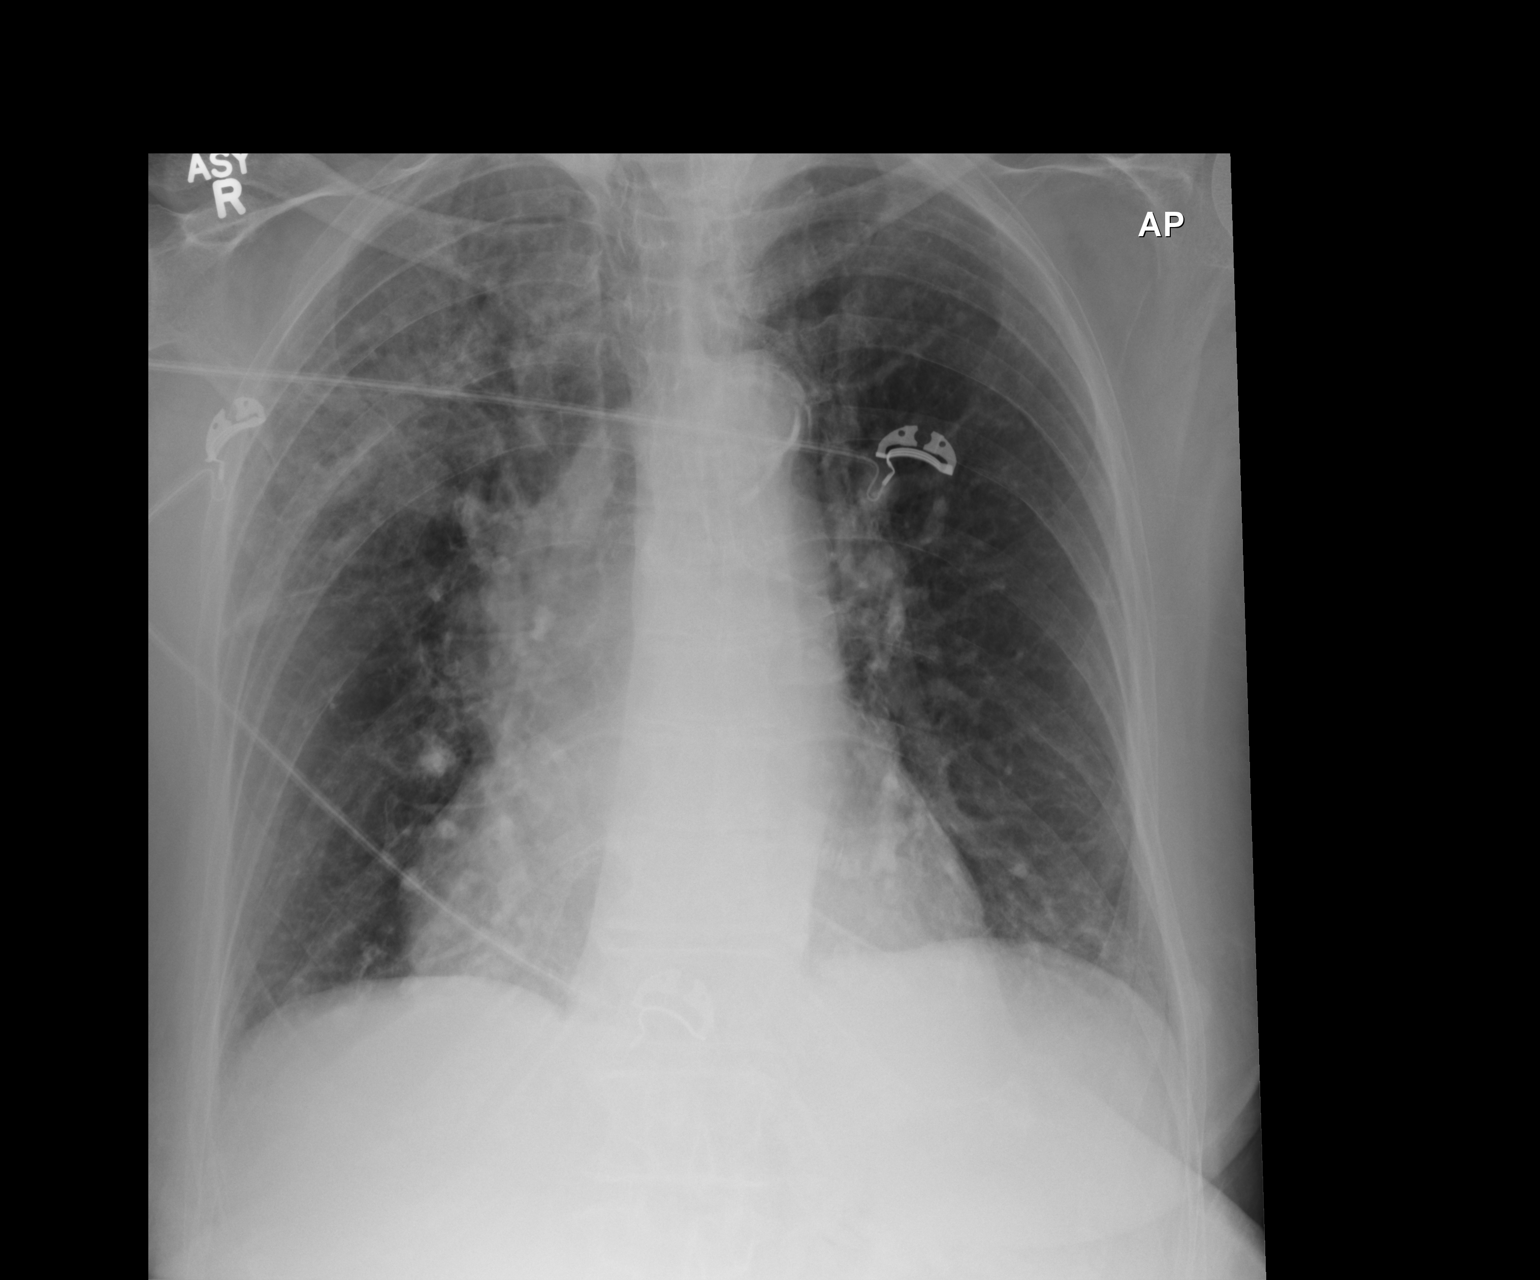

[1 of 1 positions shown; findings below may reference images not displayed]

DG CHEST 2 VIEW dated
07/11/2012; CT SUPER D CHEST WO CM dated 11/13/2013; DG CHEST 1V PORT
dated 10/16/2013
FINDINGS: The cardiac silhouette is enlarged. Atherosclerotic calcifications
identified within the aorta. There is no evidence of pneumothorax.
Diffuse density projects within the right upper lobe. No further
focal regions of consolidation or focal infiltrates appreciated.
There is prominence of the interstitial markings. The osseous
structures demonstrate no acute abnormalities.
IMPRESSION: Persistent right upper lobe density, consistent with known right
upper lobe density and likely a component of post bronchoscopy
change. No evidence of pneumothorax status post bronchoscopy.
Interstitial findings likely reflecting component of chronic
bronchitic changes. Stable cardiomegaly.
